# Patient Record
Sex: Female | Born: 1975
Health system: Southern US, Community
[De-identification: ages and names within clinical notes are randomized; demographics above are authoritative.]

## PROBLEM LIST (undated history)

## (undated) DIAGNOSIS — J45909 Unspecified asthma, uncomplicated: Secondary | ICD-10-CM

## (undated) DIAGNOSIS — Z9889 Other specified postprocedural states: Secondary | ICD-10-CM

## (undated) DIAGNOSIS — R51 Headache: Secondary | ICD-10-CM

## (undated) DIAGNOSIS — R011 Cardiac murmur, unspecified: Secondary | ICD-10-CM

## (undated) DIAGNOSIS — T8859XA Other complications of anesthesia, initial encounter: Secondary | ICD-10-CM

## (undated) DIAGNOSIS — R112 Nausea with vomiting, unspecified: Secondary | ICD-10-CM

## (undated) DIAGNOSIS — E162 Hypoglycemia, unspecified: Secondary | ICD-10-CM

## (undated) DIAGNOSIS — M3500 Sicca syndrome, unspecified: Secondary | ICD-10-CM

## (undated) DIAGNOSIS — M1990 Unspecified osteoarthritis, unspecified site: Secondary | ICD-10-CM

## (undated) DIAGNOSIS — M4800 Spinal stenosis, site unspecified: Secondary | ICD-10-CM

## (undated) DIAGNOSIS — T4145XA Adverse effect of unspecified anesthetic, initial encounter: Secondary | ICD-10-CM

## (undated) DIAGNOSIS — I499 Cardiac arrhythmia, unspecified: Secondary | ICD-10-CM

## (undated) DIAGNOSIS — G43909 Migraine, unspecified, not intractable, without status migrainosus: Secondary | ICD-10-CM

## (undated) DIAGNOSIS — R519 Headache, unspecified: Secondary | ICD-10-CM

## (undated) HISTORY — PX: ABDOMINAL HYSTERECTOMY: SHX81

## (undated) HISTORY — PX: FRACTURE SURGERY: SHX138

## (undated) HISTORY — PX: OTHER SURGICAL HISTORY: SHX169

---

## 2016-03-25 DIAGNOSIS — M4802 Spinal stenosis, cervical region: Secondary | ICD-10-CM | POA: Diagnosis not present

## 2016-03-25 DIAGNOSIS — R768 Other specified abnormal immunological findings in serum: Secondary | ICD-10-CM | POA: Diagnosis not present

## 2016-03-25 DIAGNOSIS — G43009 Migraine without aura, not intractable, without status migrainosus: Secondary | ICD-10-CM | POA: Diagnosis not present

## 2016-03-25 DIAGNOSIS — Z1239 Encounter for other screening for malignant neoplasm of breast: Secondary | ICD-10-CM | POA: Diagnosis not present

## 2016-03-26 DIAGNOSIS — R768 Other specified abnormal immunological findings in serum: Secondary | ICD-10-CM | POA: Diagnosis not present

## 2016-03-26 DIAGNOSIS — M3500 Sicca syndrome, unspecified: Secondary | ICD-10-CM | POA: Diagnosis not present

## 2016-03-26 DIAGNOSIS — Z8739 Personal history of other diseases of the musculoskeletal system and connective tissue: Secondary | ICD-10-CM | POA: Diagnosis not present

## 2016-03-26 DIAGNOSIS — M357 Hypermobility syndrome: Secondary | ICD-10-CM | POA: Diagnosis not present

## 2016-04-25 DIAGNOSIS — R768 Other specified abnormal immunological findings in serum: Secondary | ICD-10-CM | POA: Diagnosis not present

## 2016-04-25 DIAGNOSIS — M357 Hypermobility syndrome: Secondary | ICD-10-CM | POA: Diagnosis not present

## 2016-04-25 DIAGNOSIS — Z1231 Encounter for screening mammogram for malignant neoplasm of breast: Secondary | ICD-10-CM | POA: Diagnosis not present

## 2016-04-25 DIAGNOSIS — M3500 Sicca syndrome, unspecified: Secondary | ICD-10-CM | POA: Diagnosis not present

## 2016-04-25 DIAGNOSIS — Z8739 Personal history of other diseases of the musculoskeletal system and connective tissue: Secondary | ICD-10-CM | POA: Diagnosis not present

## 2016-05-21 DIAGNOSIS — Z01419 Encounter for gynecological examination (general) (routine) without abnormal findings: Secondary | ICD-10-CM | POA: Diagnosis not present

## 2016-05-21 DIAGNOSIS — Z124 Encounter for screening for malignant neoplasm of cervix: Secondary | ICD-10-CM | POA: Diagnosis not present

## 2016-05-21 DIAGNOSIS — Z113 Encounter for screening for infections with a predominantly sexual mode of transmission: Secondary | ICD-10-CM | POA: Diagnosis not present

## 2016-05-21 DIAGNOSIS — Z1151 Encounter for screening for human papillomavirus (HPV): Secondary | ICD-10-CM | POA: Diagnosis not present

## 2016-07-07 DIAGNOSIS — Z113 Encounter for screening for infections with a predominantly sexual mode of transmission: Secondary | ICD-10-CM | POA: Diagnosis not present

## 2016-07-07 DIAGNOSIS — L80 Vitiligo: Secondary | ICD-10-CM | POA: Diagnosis not present

## 2016-07-07 DIAGNOSIS — R300 Dysuria: Secondary | ICD-10-CM | POA: Diagnosis not present

## 2016-07-07 DIAGNOSIS — N898 Other specified noninflammatory disorders of vagina: Secondary | ICD-10-CM | POA: Diagnosis not present

## 2016-07-07 DIAGNOSIS — N766 Ulceration of vulva: Secondary | ICD-10-CM | POA: Diagnosis not present

## 2016-12-21 ENCOUNTER — Encounter (HOSPITAL_BASED_OUTPATIENT_CLINIC_OR_DEPARTMENT_OTHER): Payer: Self-pay | Admitting: Emergency Medicine

## 2016-12-21 ENCOUNTER — Emergency Department (HOSPITAL_BASED_OUTPATIENT_CLINIC_OR_DEPARTMENT_OTHER)
Admission: EM | Admit: 2016-12-21 | Discharge: 2016-12-21 | Disposition: A | Discharge status: Home / Self Care | Payer: BLUE CROSS/BLUE SHIELD | Attending: Emergency Medicine | Admitting: Emergency Medicine

## 2016-12-21 ENCOUNTER — Emergency Department (HOSPITAL_BASED_OUTPATIENT_CLINIC_OR_DEPARTMENT_OTHER): Payer: BLUE CROSS/BLUE SHIELD

## 2016-12-21 DIAGNOSIS — R079 Chest pain, unspecified: Secondary | ICD-10-CM | POA: Diagnosis not present

## 2016-12-21 DIAGNOSIS — J45909 Unspecified asthma, uncomplicated: Secondary | ICD-10-CM | POA: Insufficient documentation

## 2016-12-21 DIAGNOSIS — R0789 Other chest pain: Secondary | ICD-10-CM | POA: Diagnosis not present

## 2016-12-21 HISTORY — DX: Spinal stenosis, site unspecified: M48.00

## 2016-12-21 HISTORY — DX: Cardiac murmur, unspecified: R01.1

## 2016-12-21 HISTORY — DX: Unspecified asthma, uncomplicated: J45.909

## 2016-12-21 LAB — BASIC METABOLIC PANEL
Anion gap: 7 (ref 5–15)
BUN: 15 mg/dL (ref 6–20)
CO2: 24 mmol/L (ref 22–32)
Calcium: 9.4 mg/dL (ref 8.9–10.3)
Chloride: 105 mmol/L (ref 101–111)
Creatinine, Ser: 0.75 mg/dL (ref 0.44–1.00)
GFR calc Af Amer: >60 mL/min (ref >60)
GFR calc non Af Amer: >60 mL/min (ref >60)
Glucose, Bld: 94 mg/dL (ref 65–99)
Potassium: 3.8 mmol/L (ref 3.5–5.1)
Sodium: 136 mmol/L (ref 135–145)

## 2016-12-21 LAB — D-DIMER, QUANTITATIVE: D-Dimer, Quant: <0.27 ug/mL-FEU (ref 0.00–0.50)

## 2016-12-21 LAB — CBC
HCT: 37.7 % (ref 36.0–46.0)
Hemoglobin: 12.5 g/dL (ref 12.0–15.0)
MCH: 28.4 pg (ref 26.0–34.0)
MCHC: 33.2 g/dL (ref 30.0–36.0)
MCV: 85.7 fL (ref 78.0–100.0)
Platelets: 321 10*3/uL (ref 150–400)
RBC: 4.4 MIL/uL (ref 3.87–5.11)
RDW: 13.5 % (ref 11.5–15.5)
WBC: 9.5 10*3/uL (ref 4.0–10.5)

## 2016-12-21 LAB — TROPONIN I: Troponin I: <0.03 ng/mL (ref <0.03)

## 2016-12-21 MED ORDER — CYCLOBENZAPRINE HCL 5 MG PO TABS
5.0000 mg | ORAL_TABLET | Freq: Once | ORAL | Status: AC
  Administered 2016-12-21: 5 mg via ORAL
  Filled 2016-12-21: qty 1

## 2016-12-21 MED ORDER — KETOROLAC TROMETHAMINE 15 MG/ML IJ SOLN
15.0000 mg | Freq: Once | INTRAMUSCULAR | Status: AC
  Administered 2016-12-21: 15 mg via INTRAVENOUS
  Filled 2016-12-21: qty 1

## 2016-12-21 NOTE — ED Triage Notes (Signed)
Pt reports pain in L chest radiating to L shoulder and L jaw x2 months intermittent.  Pt also states that when these episodes occur, she has some sob and nausea.  Pt also mentions a migraine that shes had for 1.5 weeks.  Pt has been evaluated in clinic twice for this.

## 2016-12-21 NOTE — ED Notes (Signed)
EKG given to Dr. Rees 

## 2016-12-21 NOTE — ED Notes (Signed)
Pt given d/c instructions as per chart. Verbalizes understanding. No questions. 

## 2016-12-21 NOTE — ED Notes (Signed)
While attempting to d/c pt, pt had a couple of episodes of sharp left side CP lasting several secs then resolved. VSS. Placed back on monitor and EDP advised. Pt also requesting something to eat. Given crackers, cheese and soda.

## 2016-12-21 NOTE — ED Provider Notes (Signed)
[facility] DEPT [facility] Provider Note   CSN: [id number] Arrival date & time: 12/21/16  1617 By signing my name below, I, Dyke Brackett, attest that this documentation has been prepared under the direction and in the presence of Quintella Reichert, MD . Electronically Signed: Dyke Brackett, Scribe. 12/21/2016. 5:34 PM.   History   Chief Complaint Chief Complaint  Patient presents with  . Chest Pain   HPI Alexis Bernard is a 41 y.o. female who presents to the Emergency Department complaining of sudden onset, intermittent left chest pain onset this afternoon.She describes her pain as sharp, shooting pain that radiates to her left arm. She also describes a pressure-like pain to her central-left chest.  This is a recurrent problem; pt has had these sharp pains intermittently for 2 months, but states today was the most severe the pain has been. No alleviating or modifying factors noted.  No treatments tried PTA.  She notes associated night sweats, nonproductive cough, and nausea. She also reports chronic swelling and myalgias to her left leg which is unchanged. She denies any hx of blood clots. NKDA. She is not on any hormones. She reports family Hx of HTN. Pt denies any SOB or hemoptysis. Pt has no other complaints or symptoms at this time.    The history is provided by the patient. No language interpreter was used.    Past Medical History:  Diagnosis Date  . Asthma   . Murmur   . Spinal stenosis     There are no active problems to display for this patient.   Past Surgical History:  Procedure Laterality Date  . FRACTURE SURGERY     OB History    No data available      Home Medications    Prior to Admission medications   Not on File    Family History History reviewed. No pertinent family history.  Social History Social History  Substance Use Topics  . Smoking status: Never Smoker  . Smokeless tobacco: Not on file  . Alcohol use Yes     Comment: occ      Allergies   Patient has no known allergies.   Review of Systems Review of Systems 10 systems reviewed and all are negative for acute change except as noted in the HPI.  Physical Exam Updated Vital Signs BP 125/78 (BP Location: Left Arm)   Pulse 84   Temp 98.1 F (36.7 C) (Oral)   Resp 18   Ht 5\' 2"  (1.575 m)   Wt 163 lb (73.9 kg)   LMP 11/23/2016 (Approximate)   SpO2 100%   BMI 29.81 kg/m   Physical Exam  Constitutional: She is oriented to person, place, and time. She appears well-developed and well-nourished.  HENT:  Head: Normocephalic and atraumatic.  Cardiovascular: Normal rate and regular rhythm.   No murmur heard. Pulmonary/Chest: Effort normal and breath sounds normal. No respiratory distress. She exhibits tenderness.  Abdominal: Soft. There is no tenderness. There is no rebound and no guarding.  Musculoskeletal: She exhibits no edema or tenderness.  Neurological: She is alert and oriented to person, place, and time.  Skin: Skin is warm and dry.  Psychiatric: She has a normal mood and affect. Her behavior is normal.  Nursing note and vitals reviewed.  ED Treatments / Results  DIAGNOSTIC STUDIES:  Oxygen Saturation is 100% on RA, normal by my interpretation.    COORDINATION OF CARE:  5:28 PM Discussed treatment plan with pt at bedside and pt agreed to plan.  Labs (all labs ordered are listed, but only abnormal results are displayed) Labs Reviewed  BASIC METABOLIC PANEL  CBC  TROPONIN I  D-DIMER, QUANTITATIVE (NOT AT [facility])    EKG  EKG Interpretation  Date/Time:  Sunday December 21 2016 16:26:36 EST Ventricular Rate:  83 PR Interval:  140 QRS Duration: 88 QT Interval:  392 QTC Calculation: 460 R Axis:   100 Text Interpretation:  Normal sinus rhythm Rightward axis Borderline ECG Confirmed by Hazle Coca [phone]) on 12/21/2016 5:30:09 PM       Radiology Dg Chest 2 View  Result Date: 12/21/2016 CLINICAL DATA:  Left chest pain EXAM: CHEST  2  VIEW COMPARISON:  None. FINDINGS: Lungs are clear.  No pleural effusion or pneumothorax. The heart is normal in size. Visualized osseous structures are within normal limits. IMPRESSION: Normal chest radiographs. Electronically Signed   By: Julian Hy M.D.   On: 12/21/2016 16:53    Procedures Procedures (including critical care time)  Medications Ordered in ED Medications  ketorolac (TORADOL) 15 MG/ML injection 15 mg (15 mg Intravenous Given 12/21/16 1843)  cyclobenzaprine (FLEXERIL) tablet 5 mg (5 mg Oral Given 12/21/16 1912)     Initial Impression / Assessment and Plan / ED Course  I have reviewed the triage vital signs and the nursing notes.  Pertinent labs & imaging results that were available during my care of the patient were reviewed by me and considered in my medical decision making (see chart for details).     Patient here for evaluation of chest pain that has been intermittent over the last 2 months. There is some reproducible elements to her pain on examination. Current clinical picture is not consistent with ACS, PE, dissection, pneumonia. Discussed with patient likely musculoskeletal pain. Discussed outpatient follow-up and return precautions.  Final Clinical Impressions(s) / ED Diagnoses   Final diagnoses:  Chest wall pain   New Prescriptions There are no discharge medications for this patient. I personally performed the services described in this documentation, which was scribed in my presence. The recorded information has been reviewed and is accurate.    Quintella Reichert, MD 12/22/16 [phone]

## 2016-12-25 DIAGNOSIS — M4802 Spinal stenosis, cervical region: Secondary | ICD-10-CM | POA: Diagnosis not present

## 2016-12-25 DIAGNOSIS — M797 Fibromyalgia: Secondary | ICD-10-CM | POA: Diagnosis not present

## 2016-12-26 DIAGNOSIS — M25559 Pain in unspecified hip: Secondary | ICD-10-CM | POA: Diagnosis not present

## 2016-12-26 DIAGNOSIS — R102 Pelvic and perineal pain: Secondary | ICD-10-CM | POA: Diagnosis not present

## 2016-12-26 DIAGNOSIS — M545 Low back pain: Secondary | ICD-10-CM | POA: Diagnosis not present

## 2017-04-18 DIAGNOSIS — T300 Burn of unspecified body region, unspecified degree: Secondary | ICD-10-CM | POA: Diagnosis not present

## 2017-05-12 DIAGNOSIS — R002 Palpitations: Secondary | ICD-10-CM | POA: Diagnosis not present

## 2017-05-12 DIAGNOSIS — Z1231 Encounter for screening mammogram for malignant neoplasm of breast: Secondary | ICD-10-CM | POA: Diagnosis not present

## 2017-05-24 DIAGNOSIS — J209 Acute bronchitis, unspecified: Secondary | ICD-10-CM | POA: Diagnosis not present

## 2017-05-24 DIAGNOSIS — R062 Wheezing: Secondary | ICD-10-CM | POA: Diagnosis not present

## 2017-06-08 DIAGNOSIS — Z1231 Encounter for screening mammogram for malignant neoplasm of breast: Secondary | ICD-10-CM | POA: Diagnosis not present

## 2017-08-06 DIAGNOSIS — R350 Frequency of micturition: Secondary | ICD-10-CM | POA: Diagnosis not present

## 2017-08-06 DIAGNOSIS — M4802 Spinal stenosis, cervical region: Secondary | ICD-10-CM | POA: Diagnosis not present

## 2017-08-06 DIAGNOSIS — Z23 Encounter for immunization: Secondary | ICD-10-CM | POA: Diagnosis not present

## 2017-08-06 DIAGNOSIS — G43009 Migraine without aura, not intractable, without status migrainosus: Secondary | ICD-10-CM | POA: Diagnosis not present

## 2017-08-06 DIAGNOSIS — M797 Fibromyalgia: Secondary | ICD-10-CM | POA: Diagnosis not present

## 2017-08-11 DIAGNOSIS — R002 Palpitations: Secondary | ICD-10-CM | POA: Diagnosis not present

## 2017-08-12 DIAGNOSIS — M4802 Spinal stenosis, cervical region: Secondary | ICD-10-CM | POA: Diagnosis not present

## 2017-08-12 DIAGNOSIS — R292 Abnormal reflex: Secondary | ICD-10-CM | POA: Diagnosis not present

## 2017-08-15 DIAGNOSIS — M797 Fibromyalgia: Secondary | ICD-10-CM | POA: Diagnosis not present

## 2017-08-15 DIAGNOSIS — R350 Frequency of micturition: Secondary | ICD-10-CM | POA: Diagnosis not present

## 2017-09-11 DIAGNOSIS — M5031 Other cervical disc degeneration,  high cervical region: Secondary | ICD-10-CM | POA: Diagnosis not present

## 2017-09-11 DIAGNOSIS — M2578 Osteophyte, vertebrae: Secondary | ICD-10-CM | POA: Diagnosis not present

## 2017-09-16 DIAGNOSIS — R292 Abnormal reflex: Secondary | ICD-10-CM | POA: Diagnosis not present

## 2017-09-16 DIAGNOSIS — M4802 Spinal stenosis, cervical region: Secondary | ICD-10-CM | POA: Diagnosis not present

## 2017-09-19 DIAGNOSIS — M50221 Other cervical disc displacement at C4-C5 level: Secondary | ICD-10-CM | POA: Diagnosis not present

## 2017-09-19 DIAGNOSIS — M48061 Spinal stenosis, lumbar region without neurogenic claudication: Secondary | ICD-10-CM | POA: Diagnosis not present

## 2017-09-19 DIAGNOSIS — M4802 Spinal stenosis, cervical region: Secondary | ICD-10-CM | POA: Diagnosis not present

## 2017-09-19 DIAGNOSIS — M5125 Other intervertebral disc displacement, thoracolumbar region: Secondary | ICD-10-CM | POA: Diagnosis not present

## 2017-09-19 DIAGNOSIS — M5136 Other intervertebral disc degeneration, lumbar region: Secondary | ICD-10-CM | POA: Diagnosis not present

## 2017-09-19 DIAGNOSIS — M50222 Other cervical disc displacement at C5-C6 level: Secondary | ICD-10-CM | POA: Diagnosis not present

## 2017-09-19 DIAGNOSIS — M5124 Other intervertebral disc displacement, thoracic region: Secondary | ICD-10-CM | POA: Diagnosis not present

## 2017-09-21 DIAGNOSIS — M791 Myalgia, unspecified site: Secondary | ICD-10-CM | POA: Diagnosis not present

## 2017-09-21 DIAGNOSIS — S4991XA Unspecified injury of right shoulder and upper arm, initial encounter: Secondary | ICD-10-CM | POA: Diagnosis not present

## 2017-09-22 ENCOUNTER — Other Ambulatory Visit: Payer: Self-pay | Admitting: Neurological Surgery

## 2017-09-22 DIAGNOSIS — R030 Elevated blood-pressure reading, without diagnosis of hypertension: Secondary | ICD-10-CM | POA: Diagnosis not present

## 2017-09-22 DIAGNOSIS — M542 Cervicalgia: Secondary | ICD-10-CM | POA: Diagnosis not present

## 2017-09-22 DIAGNOSIS — Z6830 Body mass index (BMI) 30.0-30.9, adult: Secondary | ICD-10-CM | POA: Diagnosis not present

## 2017-10-05 DIAGNOSIS — R768 Other specified abnormal immunological findings in serum: Secondary | ICD-10-CM | POA: Diagnosis not present

## 2017-10-05 DIAGNOSIS — G43009 Migraine without aura, not intractable, without status migrainosus: Secondary | ICD-10-CM | POA: Diagnosis not present

## 2017-10-05 DIAGNOSIS — G959 Disease of spinal cord, unspecified: Secondary | ICD-10-CM | POA: Diagnosis not present

## 2017-10-05 DIAGNOSIS — F4310 Post-traumatic stress disorder, unspecified: Secondary | ICD-10-CM | POA: Diagnosis not present

## 2017-10-08 ENCOUNTER — Encounter: Payer: Self-pay | Admitting: Neurology

## 2017-10-22 DIAGNOSIS — R768 Other specified abnormal immunological findings in serum: Secondary | ICD-10-CM | POA: Diagnosis not present

## 2017-10-22 DIAGNOSIS — Z8739 Personal history of other diseases of the musculoskeletal system and connective tissue: Secondary | ICD-10-CM | POA: Diagnosis not present

## 2017-10-22 DIAGNOSIS — G959 Disease of spinal cord, unspecified: Secondary | ICD-10-CM | POA: Diagnosis not present

## 2017-10-22 DIAGNOSIS — R9401 Abnormal electroencephalogram [EEG]: Secondary | ICD-10-CM | POA: Diagnosis not present

## 2017-10-26 NOTE — Pre-Procedure Instructions (Signed)
[place]  10/26/2017      [facility] [phone] - [place], [place] - [address] AT [address] [place] [address] Phone: [phone] Fax: [phone]    Your procedure is scheduled on October 29, 2017.  Report to [facility] Admitting at 07:45 A.M.  Call this number if you have problems the morning of surgery:  [phone]   Remember:  Do not eat food or drink liquids after midnight.   Take these medicines the morning of surgery with A SIP OF WATER :  Cyclobenzaprine (Flexeril) if needed Sumatriptan (Imitrex) if needed Albuterol Inhaler if needed, bring with you  7 days prior to surgery STOP taking any Aspirin(unless otherwise instructed by your surgeon), Aleve, Naproxen, Meloxicam, Mobic,  Ibuprofen, Motrin, Advil, Goody's, BC's, all herbal medications, fish oil, and all vitamins.   Do not wear jewelry, make-up or nail polish.  Do not wear lotions, powders, or perfumes, or deoderant.  Do not shave 48 hours prior to surgery.   Do not bring valuables to the hospital.   [facility] is not responsible for any belongings or valuables.  Contacts, dentures or bridgework may not be worn into surgery.  Leave your suitcase in the car.  After surgery it may be brought to your room.  For patients admitted to the hospital, discharge time will be determined by your treatment team.  Patients discharged the day of surgery will not be allowed to drive home.   Special instructions:   [facility]- Preparing For Surgery  Before surgery, you can play an important role. Because skin is not sterile, your skin needs to be as free of germs as possible. You can reduce the number of germs on your skin by washing with CHG (chlorahexidine gluconate) Soap before surgery.  CHG is an antiseptic cleaner which kills germs and bonds with the skin to continue killing germs even after washing.  Please do not use if you have  an allergy to CHG or antibacterial soaps. If your skin becomes reddened/irritated stop using the CHG.  Do not shave (including legs and underarms) for at least 48 hours prior to first CHG shower. It is OK to shave your face.  Please follow these instructions carefully.   1. Shower the NIGHT BEFORE SURGERY and the MORNING OF SURGERY with CHG.   2. If you chose to wash your hair, wash your hair first as usual with your normal shampoo.  3. After you shampoo, rinse your hair and body thoroughly to remove the shampoo.  4. Use CHG as you would any other liquid soap. You can apply CHG directly to the skin and wash gently with a scrungie or a clean washcloth.   5. Apply the CHG Soap to your body ONLY FROM THE NECK DOWN.  Do not use on open wounds or open sores. Avoid contact with your eyes, ears, mouth and genitals (private parts). Wash Face and genitals (private parts)  with your normal soap.  6. Wash thoroughly, paying special attention to the area where your surgery will be performed.  7. Thoroughly rinse your body with warm water from the neck down.  8. DO NOT shower/wash with your normal soap after using and rinsing off the CHG Soap.  9. Pat yourself dry with a CLEAN TOWEL.  10. Wear CLEAN PAJAMAS to bed the night before surgery, wear comfortable clothes the morning of surgery  11. Place CLEAN  SHEETS on your bed the night of your first shower and DO NOT SLEEP WITH PETS.    Day of Surgery: Do not apply any deodorants/lotions. Please wear clean clothes to the hospital/surgery center.      Please read over the following fact sheets that you were given. Coughing and Deep Breathing and Surgical Site Infection Prevention

## 2017-10-27 ENCOUNTER — Encounter (HOSPITAL_COMMUNITY)
Admission: RE | Admit: 2017-10-27 | Discharge: 2017-10-27 | Disposition: A | Discharge status: Home / Self Care | Payer: BLUE CROSS/BLUE SHIELD | Source: Ambulatory Visit | Attending: Neurological Surgery | Admitting: Neurological Surgery

## 2017-10-27 ENCOUNTER — Encounter (HOSPITAL_COMMUNITY): Payer: Self-pay

## 2017-10-27 ENCOUNTER — Other Ambulatory Visit: Payer: Self-pay

## 2017-10-27 DIAGNOSIS — J449 Chronic obstructive pulmonary disease, unspecified: Secondary | ICD-10-CM | POA: Diagnosis not present

## 2017-10-27 DIAGNOSIS — Z7982 Long term (current) use of aspirin: Secondary | ICD-10-CM | POA: Diagnosis not present

## 2017-10-27 DIAGNOSIS — Z01812 Encounter for preprocedural laboratory examination: Secondary | ICD-10-CM | POA: Insufficient documentation

## 2017-10-27 DIAGNOSIS — Z91040 Latex allergy status: Secondary | ICD-10-CM | POA: Diagnosis not present

## 2017-10-27 DIAGNOSIS — M542 Cervicalgia: Secondary | ICD-10-CM | POA: Diagnosis not present

## 2017-10-27 DIAGNOSIS — M50022 Cervical disc disorder at C5-C6 level with myelopathy: Secondary | ICD-10-CM | POA: Diagnosis not present

## 2017-10-27 DIAGNOSIS — M1990 Unspecified osteoarthritis, unspecified site: Secondary | ICD-10-CM | POA: Diagnosis not present

## 2017-10-27 DIAGNOSIS — M4712 Other spondylosis with myelopathy, cervical region: Secondary | ICD-10-CM | POA: Diagnosis not present

## 2017-10-27 DIAGNOSIS — M4802 Spinal stenosis, cervical region: Secondary | ICD-10-CM | POA: Diagnosis not present

## 2017-10-27 DIAGNOSIS — Z79899 Other long term (current) drug therapy: Secondary | ICD-10-CM | POA: Diagnosis not present

## 2017-10-27 HISTORY — DX: Headache: R51

## 2017-10-27 HISTORY — DX: Other specified postprocedural states: Z98.890

## 2017-10-27 HISTORY — DX: Other complications of anesthesia, initial encounter: T88.59XA

## 2017-10-27 HISTORY — DX: Headache, unspecified: R51.9

## 2017-10-27 HISTORY — DX: Unspecified osteoarthritis, unspecified site: M19.90

## 2017-10-27 HISTORY — DX: Hypoglycemia, unspecified: E16.2

## 2017-10-27 HISTORY — DX: Cardiac arrhythmia, unspecified: I49.9

## 2017-10-27 HISTORY — DX: Nausea with vomiting, unspecified: R11.2

## 2017-10-27 HISTORY — DX: Adverse effect of unspecified anesthetic, initial encounter: T41.45XA

## 2017-10-27 LAB — CBC WITH DIFFERENTIAL/PLATELET
Basophils Absolute: 0 10*3/uL (ref 0.0–0.1)
Basophils Relative: 0 %
Eosinophils Absolute: 0.1 10*3/uL (ref 0.0–0.7)
Eosinophils Relative: 2 %
HCT: 38.2 % (ref 36.0–46.0)
Hemoglobin: 11.9 g/dL — ABNORMAL LOW (ref 12.0–15.0)
Lymphocytes Relative: 32 %
Lymphs Abs: 2 10*3/uL (ref 0.7–4.0)
MCH: 26.3 pg (ref 26.0–34.0)
MCHC: 31.2 g/dL (ref 30.0–36.0)
MCV: 84.5 fL (ref 78.0–100.0)
Monocytes Absolute: 0.5 10*3/uL (ref 0.1–1.0)
Monocytes Relative: 8 %
Neutro Abs: 3.7 10*3/uL (ref 1.7–7.7)
Neutrophils Relative %: 58 %
Platelets: 317 10*3/uL (ref 150–400)
RBC: 4.52 MIL/uL (ref 3.87–5.11)
RDW: 14.5 % (ref 11.5–15.5)
WBC: 6.4 10*3/uL (ref 4.0–10.5)

## 2017-10-27 LAB — BASIC METABOLIC PANEL
Anion gap: 7 (ref 5–15)
BUN: 13 mg/dL (ref 6–20)
CO2: 23 mmol/L (ref 22–32)
Calcium: 9.3 mg/dL (ref 8.9–10.3)
Chloride: 106 mmol/L (ref 101–111)
Creatinine, Ser: 0.81 mg/dL (ref 0.44–1.00)
GFR calc Af Amer: >60 mL/min (ref >60)
GFR calc non Af Amer: >60 mL/min (ref >60)
Glucose, Bld: 100 mg/dL — ABNORMAL HIGH (ref 65–99)
Potassium: 4.2 mmol/L (ref 3.5–5.1)
Sodium: 136 mmol/L (ref 135–145)

## 2017-10-27 LAB — HCG, SERUM, QUALITATIVE: Preg, Serum: NEGATIVE

## 2017-10-27 LAB — SURGICAL PCR SCREEN
MRSA, PCR: NEGATIVE
Staphylococcus aureus: NEGATIVE

## 2017-10-27 LAB — PROTIME-INR
INR: 1.06
Prothrombin Time: 13.7 seconds (ref 11.4–15.2)

## 2017-10-27 LAB — NO BLOOD PRODUCTS

## 2017-10-27 MED ORDER — CHLORHEXIDINE GLUCONATE CLOTH 2 % EX PADS: 6.0000 | MEDICATED_PAD | Freq: Once | CUTANEOUS | Status: DC

## 2017-10-27 NOTE — Progress Notes (Addendum)
PCP: Verita Schneiders, MD  Cardiologist: pt denies   EKG: 12/21/2016 in Epic  Stress test: pt denies ever  ECHO: pt denies ever  Cardiac Cath: pt denies ever  Chest x-ray: 12/2016 in [facility]

## 2017-10-28 NOTE — Anesthesia Preprocedure Evaluation (Addendum)
Anesthesia Evaluation  Patient identified by MRN, date of birth, ID band Patient awake    Reviewed: Allergy & Precautions, NPO status , Patient's Chart, lab work & pertinent test results  History of Anesthesia Complications (+) PONV  Airway Mallampati: II  TM Distance: >3 FB Neck ROM: Full    Dental  (+) Dental Advisory Given   Pulmonary asthma , COPD (last inhaler needed a few months ago),  COPD inhaler,    breath sounds clear to auscultation       Cardiovascular negative cardio ROS   Rhythm:Regular Rate:Normal     Neuro/Psych  Headaches,    GI/Hepatic negative GI ROS, Neg liver ROS,   Endo/Other  negative endocrine ROS  Renal/GU negative Renal ROS     Musculoskeletal  (+) Arthritis ,   Abdominal   Peds  Hematology  (+) anemia ,   Anesthesia Other Findings   Reproductive/Obstetrics                           Lab Results  Component Value Date   WBC 6.4 10/27/2017   HGB 11.9 (L) 10/27/2017   HCT 38.2 10/27/2017   MCV 84.5 10/27/2017   PLT 317 10/27/2017   Lab Results  Component Value Date   CREATININE 0.81 10/27/2017   BUN 13 10/27/2017   NA 136 10/27/2017   K 4.2 10/27/2017   CL 106 10/27/2017   CO2 23 10/27/2017    Anesthesia Physical Anesthesia Plan  ASA: II  Anesthesia Plan: General   Post-op Pain Management:    Induction: Intravenous  PONV Risk Score and Plan: 4 or greater and Ondansetron, Dexamethasone, Scopolamine patch - Pre-op, Midazolam and Diphenhydramine  Airway Management Planned: Oral ETT  Additional Equipment:   Intra-op Plan:   Post-operative Plan: Extubation in OR  Informed Consent: I have reviewed the patients History and Physical, chart, labs and discussed the procedure including the risks, benefits and alternatives for the proposed anesthesia with the patient or authorized representative who has indicated his/her understanding and acceptance.    Dental advisory given  Plan Discussed with: CRNA and Surgeon  Anesthesia Plan Comments: (Plan routine monitors, GETA)       Anesthesia Quick Evaluation

## 2017-10-29 ENCOUNTER — Ambulatory Visit (HOSPITAL_COMMUNITY): Payer: BLUE CROSS/BLUE SHIELD

## 2017-10-29 ENCOUNTER — Ambulatory Visit (HOSPITAL_COMMUNITY): Payer: BLUE CROSS/BLUE SHIELD | Admitting: Anesthesiology

## 2017-10-29 ENCOUNTER — Encounter (HOSPITAL_COMMUNITY): Payer: Self-pay | Admitting: *Deleted

## 2017-10-29 ENCOUNTER — Encounter (HOSPITAL_COMMUNITY)
Admission: RE | Disposition: A | Discharge status: Home / Self Care | Payer: Self-pay | Source: Ambulatory Visit | Attending: Neurological Surgery

## 2017-10-29 ENCOUNTER — Observation Stay (HOSPITAL_COMMUNITY)
Admission: RE | Admit: 2017-10-29 | Discharge: 2017-10-30 | Disposition: A | Discharge status: Home / Self Care | Payer: BLUE CROSS/BLUE SHIELD | Source: Ambulatory Visit | Attending: Neurological Surgery | Admitting: Neurological Surgery

## 2017-10-29 DIAGNOSIS — M1990 Unspecified osteoarthritis, unspecified site: Secondary | ICD-10-CM | POA: Diagnosis not present

## 2017-10-29 DIAGNOSIS — Z419 Encounter for procedure for purposes other than remedying health state, unspecified: Secondary | ICD-10-CM

## 2017-10-29 DIAGNOSIS — Z91040 Latex allergy status: Secondary | ICD-10-CM | POA: Diagnosis not present

## 2017-10-29 DIAGNOSIS — Z79899 Other long term (current) drug therapy: Secondary | ICD-10-CM | POA: Diagnosis not present

## 2017-10-29 DIAGNOSIS — Z7982 Long term (current) use of aspirin: Secondary | ICD-10-CM | POA: Diagnosis not present

## 2017-10-29 DIAGNOSIS — J449 Chronic obstructive pulmonary disease, unspecified: Secondary | ICD-10-CM | POA: Insufficient documentation

## 2017-10-29 DIAGNOSIS — M4802 Spinal stenosis, cervical region: Secondary | ICD-10-CM | POA: Diagnosis not present

## 2017-10-29 DIAGNOSIS — M50022 Cervical disc disorder at C5-C6 level with myelopathy: Secondary | ICD-10-CM | POA: Diagnosis not present

## 2017-10-29 DIAGNOSIS — M4322 Fusion of spine, cervical region: Secondary | ICD-10-CM | POA: Diagnosis not present

## 2017-10-29 DIAGNOSIS — M4712 Other spondylosis with myelopathy, cervical region: Secondary | ICD-10-CM | POA: Diagnosis not present

## 2017-10-29 DIAGNOSIS — M542 Cervicalgia: Secondary | ICD-10-CM | POA: Diagnosis not present

## 2017-10-29 HISTORY — DX: Sjogren syndrome, unspecified: M35.00

## 2017-10-29 HISTORY — PX: ANTERIOR CERVICAL DECOMP/DISCECTOMY FUSION: SHX1161

## 2017-10-29 SURGERY — ANTERIOR CERVICAL DECOMPRESSION/DISCECTOMY FUSION 2 LEVELS
Anesthesia: General

## 2017-10-29 MED ORDER — BUPIVACAINE HCL (PF) 0.25 % IJ SOLN
INTRAMUSCULAR | Status: DC | PRN
  Administered 2017-10-29: 4 mL

## 2017-10-29 MED ORDER — ONDANSETRON HCL 4 MG/2ML IJ SOLN
INTRAMUSCULAR | Status: AC
  Filled 2017-10-29: qty 4

## 2017-10-29 MED ORDER — HYDROXYZINE HCL 50 MG/ML IM SOLN
50.0000 mg | Freq: Four times a day (QID) | INTRAMUSCULAR | Status: DC | PRN
  Administered 2017-10-29: 50 mg via INTRAMUSCULAR
  Filled 2017-10-29: qty 1

## 2017-10-29 MED ORDER — ONDANSETRON HCL 4 MG PO TABS: 4.0000 mg | ORAL_TABLET | Freq: Four times a day (QID) | ORAL | Status: DC | PRN

## 2017-10-29 MED ORDER — ALBUTEROL SULFATE (2.5 MG/3ML) 0.083% IN NEBU: 3.0000 mL | INHALATION_SOLUTION | Freq: Four times a day (QID) | RESPIRATORY_TRACT | Status: DC | PRN

## 2017-10-29 MED ORDER — CEFAZOLIN SODIUM-DEXTROSE 2-4 GM/100ML-% IV SOLN
2.0000 g | Freq: Three times a day (TID) | INTRAVENOUS | Status: AC
  Administered 2017-10-29 – 2017-10-30 (×2): 2 g via INTRAVENOUS
  Filled 2017-10-29 (×2): qty 100

## 2017-10-29 MED ORDER — ROCURONIUM BROMIDE 10 MG/ML (PF) SYRINGE
PREFILLED_SYRINGE | INTRAVENOUS | Status: AC
  Filled 2017-10-29: qty 5

## 2017-10-29 MED ORDER — FENTANYL CITRATE (PF) 250 MCG/5ML IJ SOLN
INTRAMUSCULAR | Status: AC
Start: 2017-10-29 — End: 2017-10-29
  Filled 2017-10-29: qty 5

## 2017-10-29 MED ORDER — THROMBIN (RECOMBINANT) 5000 UNITS EX SOLR
CUTANEOUS | Status: DC | PRN
  Administered 2017-10-29 (×2): 5000 [IU] via TOPICAL

## 2017-10-29 MED ORDER — EPHEDRINE 5 MG/ML INJ
INTRAVENOUS | Status: AC
  Filled 2017-10-29: qty 10

## 2017-10-29 MED ORDER — SODIUM CHLORIDE 0.9% FLUSH: 3.0000 mL | INTRAVENOUS | Status: DC | PRN

## 2017-10-29 MED ORDER — FENTANYL CITRATE (PF) 100 MCG/2ML IJ SOLN
INTRAMUSCULAR | Status: DC | PRN
  Administered 2017-10-29: 50 ug via INTRAVENOUS
  Administered 2017-10-29: 200 ug via INTRAVENOUS

## 2017-10-29 MED ORDER — SCOPOLAMINE 1 MG/3DAYS TD PT72
MEDICATED_PATCH | TRANSDERMAL | Status: DC | PRN
  Administered 2017-10-29: 1 via TRANSDERMAL

## 2017-10-29 MED ORDER — MIDAZOLAM HCL 2 MG/2ML IJ SOLN
INTRAMUSCULAR | Status: AC
  Filled 2017-10-29: qty 2

## 2017-10-29 MED ORDER — EPHEDRINE SULFATE 50 MG/ML IJ SOLN
INTRAMUSCULAR | Status: DC | PRN
  Administered 2017-10-29: 10 mg via INTRAVENOUS

## 2017-10-29 MED ORDER — HYDROCODONE-ACETAMINOPHEN 7.5-325 MG PO TABS
ORAL_TABLET | ORAL | Status: AC
  Administered 2017-10-29: 2 via ORAL
  Filled 2017-10-29: qty 2

## 2017-10-29 MED ORDER — LIDOCAINE HCL (CARDIAC) 20 MG/ML IV SOLN
INTRAVENOUS | Status: DC | PRN
  Administered 2017-10-29: 20 mg via INTRAVENOUS

## 2017-10-29 MED ORDER — DIPHENHYDRAMINE HCL 50 MG/ML IJ SOLN
25.0000 mg | Freq: Four times a day (QID) | INTRAMUSCULAR | Status: DC | PRN
  Administered 2017-10-29: 25 mg via INTRAVENOUS

## 2017-10-29 MED ORDER — MENTHOL 3 MG MT LOZG: 1.0000 | LOZENGE | OROMUCOSAL | Status: DC | PRN

## 2017-10-29 MED ORDER — HYDROMORPHONE HCL 1 MG/ML IJ SOLN
INTRAMUSCULAR | Status: AC
  Administered 2017-10-29: 0.5 mg via INTRAVENOUS
  Filled 2017-10-29: qty 1

## 2017-10-29 MED ORDER — MEPERIDINE HCL 25 MG/ML IJ SOLN
6.2500 mg | INTRAMUSCULAR | Status: DC | PRN
Start: 2017-10-29 — End: 2017-10-29

## 2017-10-29 MED ORDER — POTASSIUM CHLORIDE IN NACL 20-0.9 MEQ/L-% IV SOLN: INTRAVENOUS | Status: DC

## 2017-10-29 MED ORDER — SODIUM CHLORIDE 0.9% FLUSH
3.0000 mL | Freq: Two times a day (BID) | INTRAVENOUS | Status: DC
  Administered 2017-10-29: 3 mL via INTRAVENOUS

## 2017-10-29 MED ORDER — METHOCARBAMOL 500 MG PO TABS
500.0000 mg | ORAL_TABLET | Freq: Four times a day (QID) | ORAL | Status: DC | PRN
  Administered 2017-10-29 – 2017-10-30 (×3): 500 mg via ORAL
  Filled 2017-10-29 (×3): qty 1

## 2017-10-29 MED ORDER — DEXAMETHASONE SODIUM PHOSPHATE 10 MG/ML IJ SOLN
10.0000 mg | INTRAMUSCULAR | Status: AC
  Administered 2017-10-29: 10 mg via INTRAVENOUS
  Filled 2017-10-29: qty 1

## 2017-10-29 MED ORDER — LIDOCAINE 2% (20 MG/ML) 5 ML SYRINGE
INTRAMUSCULAR | Status: AC
  Filled 2017-10-29: qty 5

## 2017-10-29 MED ORDER — MIDAZOLAM HCL 2 MG/2ML IJ SOLN
INTRAMUSCULAR | Status: AC
  Administered 2017-10-29: 2 mg via INTRAVENOUS
  Filled 2017-10-29: qty 2

## 2017-10-29 MED ORDER — MIDAZOLAM HCL 5 MG/5ML IJ SOLN
INTRAMUSCULAR | Status: DC | PRN
  Administered 2017-10-29: 2 mg via INTRAVENOUS

## 2017-10-29 MED ORDER — ONDANSETRON HCL 4 MG/2ML IJ SOLN
4.0000 mg | Freq: Four times a day (QID) | INTRAMUSCULAR | Status: DC | PRN
  Administered 2017-10-29: 4 mg via INTRAVENOUS
  Filled 2017-10-29: qty 2

## 2017-10-29 MED ORDER — BUPIVACAINE HCL (PF) 0.25 % IJ SOLN
INTRAMUSCULAR | Status: AC
  Filled 2017-10-29: qty 30

## 2017-10-29 MED ORDER — CEFAZOLIN SODIUM-DEXTROSE 2-4 GM/100ML-% IV SOLN
2.0000 g | INTRAVENOUS | Status: AC
  Administered 2017-10-29: 2 g via INTRAVENOUS
  Filled 2017-10-29: qty 100

## 2017-10-29 MED ORDER — HYDROMORPHONE HCL 1 MG/ML IJ SOLN
0.2500 mg | INTRAMUSCULAR | Status: DC | PRN
  Administered 2017-10-29 (×4): 0.5 mg via INTRAVENOUS

## 2017-10-29 MED ORDER — SUGAMMADEX SODIUM 200 MG/2ML IV SOLN
INTRAVENOUS | Status: AC
  Filled 2017-10-29: qty 2

## 2017-10-29 MED ORDER — 0.9 % SODIUM CHLORIDE (POUR BTL) OPTIME
TOPICAL | Status: DC | PRN
  Administered 2017-10-29: 1000 mL

## 2017-10-29 MED ORDER — SCOPOLAMINE 1 MG/3DAYS TD PT72
MEDICATED_PATCH | TRANSDERMAL | Status: AC
  Filled 2017-10-29: qty 1

## 2017-10-29 MED ORDER — DIPHENHYDRAMINE HCL 50 MG/ML IJ SOLN
INTRAMUSCULAR | Status: AC
  Filled 2017-10-29: qty 1

## 2017-10-29 MED ORDER — BACITRACIN 50000 UNITS IM SOLR
INTRAMUSCULAR | Status: DC | PRN
  Administered 2017-10-29: 09:00:00

## 2017-10-29 MED ORDER — PHENYLEPHRINE HCL 10 MG/ML IJ SOLN
INTRAMUSCULAR | Status: DC | PRN
  Administered 2017-10-29: 80 ug via INTRAVENOUS

## 2017-10-29 MED ORDER — PROMETHAZINE HCL 25 MG/ML IJ SOLN: 6.2500 mg | INTRAMUSCULAR | Status: DC | PRN

## 2017-10-29 MED ORDER — METHOCARBAMOL 1000 MG/10ML IJ SOLN
500.0000 mg | Freq: Four times a day (QID) | INTRAVENOUS | Status: DC | PRN
  Filled 2017-10-29: qty 5

## 2017-10-29 MED ORDER — ACETAMINOPHEN 325 MG PO TABS: 650.0000 mg | ORAL_TABLET | ORAL | Status: DC | PRN

## 2017-10-29 MED ORDER — HYDROCODONE-ACETAMINOPHEN 7.5-325 MG PO TABS
2.0000 | ORAL_TABLET | ORAL | Status: DC | PRN
  Administered 2017-10-29 – 2017-10-30 (×5): 2 via ORAL
  Filled 2017-10-29 (×5): qty 2

## 2017-10-29 MED ORDER — ROCURONIUM BROMIDE 100 MG/10ML IV SOLN
INTRAVENOUS | Status: DC | PRN
  Administered 2017-10-29: 50 mg via INTRAVENOUS
  Administered 2017-10-29: 20 mg via INTRAVENOUS

## 2017-10-29 MED ORDER — ACETAMINOPHEN 650 MG RE SUPP
650.0000 mg | RECTAL | Status: DC | PRN
Start: 2017-10-29 — End: 2017-10-30

## 2017-10-29 MED ORDER — MORPHINE SULFATE (PF) 4 MG/ML IV SOLN
2.0000 mg | INTRAVENOUS | Status: DC | PRN
  Filled 2017-10-29: qty 1

## 2017-10-29 MED ORDER — LACTATED RINGERS IV SOLN
INTRAVENOUS | Status: DC
  Administered 2017-10-29: 09:00:00 via INTRAVENOUS
  Administered 2017-10-29: 10 mL/h via INTRAVENOUS

## 2017-10-29 MED ORDER — SENNA 8.6 MG PO TABS
1.0000 | ORAL_TABLET | Freq: Two times a day (BID) | ORAL | Status: DC
  Administered 2017-10-30: 8.6 mg via ORAL
  Filled 2017-10-29: qty 1

## 2017-10-29 MED ORDER — PROPOFOL 10 MG/ML IV BOLUS
INTRAVENOUS | Status: DC | PRN
  Administered 2017-10-29: 150 mg via INTRAVENOUS

## 2017-10-29 MED ORDER — THROMBIN (RECOMBINANT) 5000 UNITS EX SOLR
CUTANEOUS | Status: AC
  Filled 2017-10-29: qty 10000

## 2017-10-29 MED ORDER — PROPOFOL 10 MG/ML IV BOLUS
INTRAVENOUS | Status: AC
  Filled 2017-10-29: qty 40

## 2017-10-29 MED ORDER — ONDANSETRON HCL 4 MG/2ML IJ SOLN
INTRAMUSCULAR | Status: DC | PRN
  Administered 2017-10-29: 4 mg via INTRAVENOUS

## 2017-10-29 MED ORDER — HEMOSTATIC AGENTS (NO CHARGE) OPTIME
TOPICAL | Status: DC | PRN
  Administered 2017-10-29 (×2): 1 via TOPICAL

## 2017-10-29 MED ORDER — PHENOL 1.4 % MT LIQD: 1.0000 | OROMUCOSAL | Status: DC | PRN

## 2017-10-29 MED ORDER — SUGAMMADEX SODIUM 200 MG/2ML IV SOLN
INTRAVENOUS | Status: DC | PRN
  Administered 2017-10-29: 150 mg via INTRAVENOUS

## 2017-10-29 MED ORDER — MIDAZOLAM HCL 2 MG/2ML IJ SOLN
0.5000 mg | Freq: Once | INTRAMUSCULAR | Status: AC | PRN
  Administered 2017-10-29: 2 mg via INTRAVENOUS

## 2017-10-29 SURGICAL SUPPLY — 57 items
BAG DECANTER FOR FLEXI CONT (MISCELLANEOUS) ×2 IMPLANT
BASKET BONE COLLECTION (BASKET) ×2 IMPLANT
BENZOIN TINCTURE PRP APPL 2/3 (GAUZE/BANDAGES/DRESSINGS) ×2 IMPLANT
BIT DRILL 2.3 12 FIXED (INSTRUMENTS) ×1 IMPLANT
BUR MATCHSTICK NEURO 3.0 LAGG (BURR) ×2 IMPLANT
CAGE CERVICAL 8 (Cage) ×2 IMPLANT
CANISTER SUCT 3000ML PPV (MISCELLANEOUS) ×2 IMPLANT
CARTRIDGE OIL MAESTRO DRILL (MISCELLANEOUS) ×1 IMPLANT
DERMABOND ADVANCED (GAUZE/BANDAGES/DRESSINGS) ×1
DERMABOND ADVANCED .7 DNX12 (GAUZE/BANDAGES/DRESSINGS) ×1 IMPLANT
DIFFUSER DRILL AIR PNEUMATIC (MISCELLANEOUS) ×2 IMPLANT
DRAPE C-ARM 42X72 X-RAY (DRAPES) ×4 IMPLANT
DRAPE LAPAROTOMY 100X72 PEDS (DRAPES) ×2 IMPLANT
DRAPE MICROSCOPE LEICA (MISCELLANEOUS) ×2 IMPLANT
DRAPE POUCH INSTRU U-SHP 10X18 (DRAPES) ×2 IMPLANT
DRILL 12MM (INSTRUMENTS) ×2
DRSG OPSITE POSTOP 3X4 (GAUZE/BANDAGES/DRESSINGS) ×2 IMPLANT
DURAPREP 6ML APPLICATOR 50/CS (WOUND CARE) ×2 IMPLANT
ELECT COATED BLADE 2.86 ST (ELECTRODE) ×2 IMPLANT
ELECT REM PT RETURN 9FT ADLT (ELECTROSURGICAL) ×2
ELECTRODE REM PT RTRN 9FT ADLT (ELECTROSURGICAL) ×1 IMPLANT
GAUZE SPONGE 4X4 16PLY XRAY LF (GAUZE/BANDAGES/DRESSINGS) IMPLANT
GLOVE BIO SURGEON STRL SZ7 (GLOVE) IMPLANT
GLOVE BIO SURGEON STRL SZ8 (GLOVE) IMPLANT
GLOVE BIOGEL PI IND STRL 7.0 (GLOVE) ×4 IMPLANT
GLOVE BIOGEL PI IND STRL 7.5 (GLOVE) ×1 IMPLANT
GLOVE BIOGEL PI IND STRL 8 (GLOVE) ×1 IMPLANT
GLOVE BIOGEL PI INDICATOR 7.0 (GLOVE) ×4
GLOVE BIOGEL PI INDICATOR 7.5 (GLOVE) ×1
GLOVE BIOGEL PI INDICATOR 8 (GLOVE) ×1
GOWN STRL REUS W/ TWL LRG LVL3 (GOWN DISPOSABLE) ×2 IMPLANT
GOWN STRL REUS W/ TWL XL LVL3 (GOWN DISPOSABLE) ×1 IMPLANT
GOWN STRL REUS W/TWL 2XL LVL3 (GOWN DISPOSABLE) IMPLANT
GOWN STRL REUS W/TWL LRG LVL3 (GOWN DISPOSABLE) ×2
GOWN STRL REUS W/TWL XL LVL3 (GOWN DISPOSABLE) ×1
HEMOSTAT POWDER KIT SURGIFOAM (HEMOSTASIS) ×4 IMPLANT
KIT BASIN OR (CUSTOM PROCEDURE TRAY) ×2 IMPLANT
KIT ROOM TURNOVER OR (KITS) ×2 IMPLANT
NEEDLE HYPO 25X1 1.5 SAFETY (NEEDLE) ×2 IMPLANT
NEEDLE SPNL 20GX3.5 QUINCKE YW (NEEDLE) ×2 IMPLANT
NS IRRIG 1000ML POUR BTL (IV SOLUTION) ×2 IMPLANT
OIL CARTRIDGE MAESTRO DRILL (MISCELLANEOUS) ×2
PACK LAMINECTOMY NEURO (CUSTOM PROCEDURE TRAY) ×2 IMPLANT
PAD ARMBOARD 7.5X6 YLW CONV (MISCELLANEOUS) ×6 IMPLANT
PIN DISTRACTION 14MM (PIN) ×4 IMPLANT
PLATE 30MM (Plate) ×2 IMPLANT
RUBBERBAND STERILE (MISCELLANEOUS) ×4 IMPLANT
SCREW 12MM (Screw) ×12 IMPLANT
SPACER SPNL 7D LRG 16X14X8XNS (Cage) ×2 IMPLANT
SPCR SPNL 7D LRG 16X14X8XNS (Cage) ×2 IMPLANT
SPONGE INTESTINAL PEANUT (DISPOSABLE) ×2 IMPLANT
SPONGE SURGIFOAM ABS GEL SZ50 (HEMOSTASIS) ×2 IMPLANT
STRIP CLOSURE SKIN 1/2X4 (GAUZE/BANDAGES/DRESSINGS) ×2 IMPLANT
SUT VIC AB 3-0 SH 8-18 (SUTURE) ×4 IMPLANT
TOWEL GREEN STERILE (TOWEL DISPOSABLE) ×2 IMPLANT
TOWEL GREEN STERILE FF (TOWEL DISPOSABLE) ×2 IMPLANT
WATER STERILE IRR 1000ML POUR (IV SOLUTION) ×2 IMPLANT

## 2017-10-29 NOTE — Transfer of Care (Signed)
Immediate Anesthesia Transfer of Care Note  Patient: Alexis Bernard  Procedure(s) Performed: Anterior Cervical Decompression Fusion - Cervical four-Cervical five - Cervical five-Cervical six (N/A )  Patient Location: PACU  Anesthesia Type:General  Level of Consciousness: awake, alert , oriented and sedated  Airway & Oxygen Therapy: Patient Spontanous Breathing and Patient connected to nasal cannula oxygen  Post-op Assessment: Report given to RN, Post -op Vital signs reviewed and stable and Patient moving all extremities  Post vital signs: Reviewed and stable  Last Vitals:  Vitals:   10/29/17 0841  BP: 105/66  Pulse: 70  Resp: 20  Temp: 36.6 C  SpO2: 100%    Last Pain:  Vitals:   10/29/17 0841  TempSrc: Oral         Complications: No apparent anesthesia complications

## 2017-10-29 NOTE — Op Note (Signed)
10/29/2017  12:31 PM  PATIENT:  Alexis Bernard  41 y.o. female  PRE-OPERATIVE DIAGNOSIS:  Cervical spinal stenosis C4-5 and C5-6 with signal change of the spinal cord and cervical spondylitic myelopathy  POST-OPERATIVE DIAGNOSIS:  same  PROCEDURE:  1. Decompressive anterior cervical discectomy C4-5 and C5-6, 2. Anterior cervical arthrodesis C4-5 and C5-6 utilizing a peek interbody cage packed with locally harvested morcellized autologous bone graft, 3. Anterior cervical plating C4-5 C5-6 utilizing a atec plate  SURGEON:  Sherley Bounds, MD  ASSISTANTS: Meyran FNP  ANESTHESIA:   General  EBL: 200 ml  Total I/O In: -  Out: 200 [Blood:200]  BLOOD ADMINISTERED: none  DRAINS: none  SPECIMEN:  none  INDICATION FOR PROCEDURE: This patient presented with neck pain and arm pain with numbness and tingling in the hands. Imaging showed spinal stenosis C4-5 and C5-6 with cord compression and signal change in the cord at C4-5. The patient tried conservative measures without relief. Pain was debilitating. Recommended ACDF with plating. Patient understood the risks, benefits, and alternatives and potential outcomes and wished to proceed.  PROCEDURE DETAILS: Patient was brought to the operating room placed under general endotracheal anesthesia. Patient was placed in the supine position on the operating room table. The neck was prepped with Duraprep and draped in a sterile fashion.   Three cc of local anesthesia was injected and a transverse incision was made on the right side of the neck.  Dissection was carried down thru the subcutaneous tissue and the platysma was  elevated, opened, and undermined with Metzenbaum scissors.  Dissection was then carried out thru an avascular plane leaving the sternocleidomastoid carotid artery and jugular vein laterally and the trachea and esophagus medially. The ventral aspect of the vertebral column was identified and a localizing x-ray was taken. The C4-5  level was identified. The longus colli muscles were then elevated and the retractor was placed. The annulus at C4-5 and C5-6 was incised and the disc space entered. Discectomy was performed with micro-curettes and pituitary rongeurs. I then used the high-speed drill to drill the endplates down to the level of the posterior longitudinal ligament. The drill shavings were saved in a mucous trap for later arthrodesis. The operating microscope was draped and brought into the field provided additional magnification, illumination and visualization. Discectomy was continued posteriorly thru the disc space. Posterior longitudinal ligament was opened with a nerve hook, and then removed along with disc herniation and osteophytes, decompressing the spinal canal and thecal sac. We then continued to remove osteophytic overgrowth and disc material decompressing the neural foramina and exiting nerve roots bilaterally. The scope was angled up and down to help decompress and undercut the vertebral bodies. Once the decompression was completed we could pass a nerve hook circumferentially to assure adequate decompression in the midline and in the neural foramina. So by both visualization and palpation we felt we had an adequate decompression of the neural elements. We then measured the height of the intravertebral disc space and selected an 8 millimeter Peek interbody cage packed with autograft for each level. It was then gently positioned in the intravertebral disc space(s) and countersunk. I then used a Alphatec plate and placed variable angle screws into the vertebral bodies of each level and locked them into position. The wound was irrigated with bacitracin solution, checked for hemostasis which was established and confirmed. Once meticulous hemostasis was achieved, we then proceeded with closure. The platysma was closed with interrupted 3-0 undyed Vicryl suture, the subcuticular layer was  closed with interrupted 3-0 undyed Vicryl  suture. The skin edges were approximated with steristrips. The drapes were removed. A sterile dressing was applied. The patient was then awakened from general anesthesia and transferred to the recovery room in stable condition. At the end of the procedure all sponge, needle and instrument counts were correct.   PLAN OF CARE: Admit for overnight observation  PATIENT DISPOSITION:  PACU - hemodynamically stable.   Delay start of Pharmacological VTE agent (>24hrs) due to surgical blood loss or risk of bleeding:  yes

## 2017-10-29 NOTE — Anesthesia Postprocedure Evaluation (Signed)
Anesthesia Post Note  Patient: Alexis Bernard  Procedure(s) Performed: Anterior Cervical Decompression Fusion - Cervical four-Cervical five - Cervical five-Cervical six (N/A )     Patient location during evaluation: PACU Anesthesia Type: General Level of consciousness: awake and alert, oriented and patient cooperative Pain management: pain level controlled (pain improving) Vital Signs Assessment: post-procedure vital signs reviewed and stable Respiratory status: spontaneous breathing, nonlabored ventilation and respiratory function stable Cardiovascular status: blood pressure returned to baseline and stable Postop Assessment: no apparent nausea or vomiting Anesthetic complications: no    Last Vitals:  Vitals:   10/29/17 1415 10/29/17 1430  BP: (!) 94/56 (!) 100/56  Pulse: 79 78  Resp: (!) 22 17  Temp:  36.7 C  SpO2: 99% 97%    Last Pain:  Vitals:   10/29/17 1400  TempSrc:   PainSc: 7                  Sair Faulcon,E. Daleiza Bacchi

## 2017-10-29 NOTE — H&P (Signed)
Subjective:   Patient is a 41 y.o. female admitted for cervical stenosis. The patient first presented to me with complaints of neck pain, shooting pains in the arm(s) and numbness of the arm(s). Onset of symptoms was several months ago. The pain is described as aching and occurs all day. The pain is rated severe, and is located in the neck and radiates to the arms. The symptoms have been progressive. Symptoms are exacerbated by none, and are relieved by none.  Previous work up includes MRI of cervical spine, results: spinal stenosis.  Past Medical History:  Diagnosis Date  . Arthritis   . Asthma   . Complication of anesthesia   . Dysrhythmia    as a child  . Headache    migraines  . Hypoglycemia   . Murmur    pt denies currently, as a child  . PONV (postoperative nausea and vomiting)   . Sjogren's disease ([place])   . Spinal stenosis     Past Surgical History:  Procedure Laterality Date  . FRACTURE SURGERY     anklex4  . laproscopic surgery to evaluate endometriosis      Allergies  Allergen Reactions  . Latex Itching and Rash    Social History   Tobacco Use  . Smoking status: Never Smoker  . Smokeless tobacco: Never Used  Substance Use Topics  . Alcohol use: Yes    Comment: occ     History reviewed. No pertinent family history. Prior to Admission medications   Medication Sig Start Date End Date Taking? Authorizing Provider  aspirin-acetaminophen-caffeine (EXCEDRIN MIGRAINE) [phone] MG tablet Take 2 tablets by mouth 2 (two) times daily as needed for migraine.    Yes [provider]  Aspirin-Acetaminophen-Caffeine (PAMPRIN MAX PO) Take 3 tablets by mouth daily as needed (CRAMPS).   Yes [provider]  cyclobenzaprine (FLEXERIL) 10 MG tablet Take 10 mg by mouth daily as needed for muscle spasms.   Yes [provider]  ergocalciferol (VITAMIN D2) 50000 units capsule Take 50,000 Units by mouth once a week. Mondays   Yes [provider]   ibuprofen (ADVIL,MOTRIN) 200 MG tablet Take 600 mg by mouth every 8 (eight) hours as needed for mild pain.   Yes [provider]  meloxicam (MOBIC) 15 MG tablet Take 15 mg by mouth daily as needed for pain.  09/21/17  Yes [provider]  Prenatal MV-Min-FA-Omega-3 (PRENATAL GUMMIES/DHA & FA PO) Take 2 each by mouth daily.   Yes [provider]  albuterol (PROVENTIL HFA;VENTOLIN HFA) 108 (90 Base) MCG/ACT inhaler Inhale 2 puffs into the lungs every 6 (six) hours as needed for wheezing or shortness of breath.    [provider]  naproxen sodium (ALEVE) 220 MG tablet Take 440 mg by mouth 2 (two) times daily as needed (PAIN).    [provider]  SUMAtriptan (IMITREX) 50 MG tablet TAKE 1 TABLET BY MOUTH  EVRY 2 HOURS AS NEEDED MIGRAINE 08/22/17   [provider]     Review of Systems  Positive ROS: neg  All other systems have been reviewed and were otherwise negative with the exception of those mentioned in the HPI and as above.  Objective: Vital signs in last 24 hours: Temp:  [97.9 F (36.6 C)] 97.9 F (36.6 C) (12/20 0841) Pulse Rate:  [70] 70 (12/20 0841) Resp:  [20] 20 (12/20 0841) BP: (105)/(66) 105/66 (12/20 0841) SpO2:  [100 %] 100 % (12/20 0841)  General Appearance: Alert, cooperative, no distress, appears  stated age Head: Normocephalic, without obvious abnormality, atraumatic Eyes: PERRL, conjunctiva/corneas clear, EOM's intact      Neck: Supple, symmetrical, trachea midline, Back: Symmetric, no curvature, ROM normal, no CVA tenderness Lungs:  respirations unlabored Heart: Regular rate and rhythm Abdomen: Soft, non-tender Extremities: Extremities normal, atraumatic, no cyanosis or edema Pulses: 2+ and symmetric all extremities Skin: Skin color, texture, turgor normal, no rashes or lesions  NEUROLOGIC:  Mental status: Alert and oriented x4, no aphasia, good attention span, fund of knowledge and memory  Motor Exam -  grossly normal Sensory Exam - grossly normal Reflexes: 1+ Coordination - grossly normal Gait - grossly normal Balance - grossly normal Cranial Nerves: I: smell Not tested  II: visual acuity  OS: nl    OD: nl  II: visual fields Full to confrontation  II: pupils Equal, round, reactive to light  III,VII: ptosis None  III,IV,VI: extraocular muscles  Full ROM  V: mastication Normal  V: facial light touch sensation  Normal  V,VII: corneal reflex  Present  VII: facial muscle function - upper  Normal  VII: facial muscle function - lower Normal  VIII: hearing Not tested  IX: soft palate elevation  Normal  IX,X: gag reflex Present  XI: trapezius strength  5/5  XI: sternocleidomastoid strength 5/5  XI: neck flexion strength  5/5  XII: tongue strength  Normal    Data Review Lab Results  Component Value Date   WBC 6.4 10/27/2017   HGB 11.9 (L) 10/27/2017   HCT 38.2 10/27/2017   MCV 84.5 10/27/2017   PLT 317 10/27/2017   Lab Results  Component Value Date   NA 136 10/27/2017   K 4.2 10/27/2017   CL 106 10/27/2017   CO2 23 10/27/2017   BUN 13 10/27/2017   CREATININE 0.81 10/27/2017   GLUCOSE 100 (H) 10/27/2017   Lab Results  Component Value Date   INR 1.06 10/27/2017    Assessment:   Cervical neck pain with herniated nucleus pulposus/ spondylosis/ stenosis at C45, C5-6. Patient has failed conservative therapy. Planned surgery : ACDF C4-5 C5-6  Plan:   I explained the condition and procedure to the patient and answered any questions.  Patient wishes to proceed with procedure as planned. Understands risks/ benefits/ and expected or typical outcomes.  JONES,DAVID S 10/29/2017 9:13 AM

## 2017-10-29 NOTE — Anesthesia Procedure Notes (Signed)
Procedure Name: Intubation Date/Time: 10/29/2017 8:53 AM Performed by: Scheryl Darter, CRNA Pre-anesthesia Checklist: Patient identified, Emergency Drugs available, Suction available and Patient being monitored Patient Re-evaluated:Patient Re-evaluated prior to induction Oxygen Delivery Method: Circle System Utilized Preoxygenation: Pre-oxygenation with 100% oxygen Induction Type: IV induction Ventilation: Mask ventilation without difficulty Laryngoscope Size: Mac and 3 Grade View: Grade I Tube type: Oral Tube size: 7.0 mm Number of attempts: 1 Airway Equipment and Method: Stylet and Oral airway Placement Confirmation: ETT inserted through vocal cords under direct vision,  positive ETCO2 and breath sounds checked- equal and bilateral Secured at: 22 cm Tube secured with: Tape Dental Injury: Teeth and Oropharynx as per pre-operative assessment

## 2017-10-30 ENCOUNTER — Other Ambulatory Visit: Payer: Self-pay

## 2017-10-30 ENCOUNTER — Encounter (HOSPITAL_COMMUNITY): Payer: Self-pay | Admitting: Neurological Surgery

## 2017-10-30 DIAGNOSIS — Z7982 Long term (current) use of aspirin: Secondary | ICD-10-CM | POA: Diagnosis not present

## 2017-10-30 DIAGNOSIS — M50022 Cervical disc disorder at C5-C6 level with myelopathy: Secondary | ICD-10-CM | POA: Diagnosis not present

## 2017-10-30 DIAGNOSIS — J449 Chronic obstructive pulmonary disease, unspecified: Secondary | ICD-10-CM | POA: Diagnosis not present

## 2017-10-30 DIAGNOSIS — M4802 Spinal stenosis, cervical region: Secondary | ICD-10-CM | POA: Diagnosis not present

## 2017-10-30 DIAGNOSIS — Z91040 Latex allergy status: Secondary | ICD-10-CM | POA: Diagnosis not present

## 2017-10-30 DIAGNOSIS — Z79899 Other long term (current) drug therapy: Secondary | ICD-10-CM | POA: Diagnosis not present

## 2017-10-30 DIAGNOSIS — M1990 Unspecified osteoarthritis, unspecified site: Secondary | ICD-10-CM | POA: Diagnosis not present

## 2017-10-30 DIAGNOSIS — M4712 Other spondylosis with myelopathy, cervical region: Secondary | ICD-10-CM | POA: Diagnosis not present

## 2017-10-30 MED ORDER — CYCLOBENZAPRINE HCL 10 MG PO TABS: 10.0000 mg | ORAL_TABLET | Freq: Every day | ORAL | 0 refills | Status: DC | PRN

## 2017-10-30 MED ORDER — HYDROCODONE-ACETAMINOPHEN 7.5-325 MG PO TABS: 2.0000 | ORAL_TABLET | ORAL | 0 refills | Status: DC | PRN

## 2017-10-30 NOTE — Progress Notes (Signed)
Patient is discharged from room 3C09 at this time. Alert and in stable condition. IV site d/c'd and instructions read to patient and spouse with understanding verbalized. Left unit via wheelchair with all belongings at side. 

## 2017-10-30 NOTE — Discharge Summary (Signed)
Physician Discharge Summary  Patient ID: Alexis Bernard MRN: [id number] DOB/AGE: 03-11-1976 41 y.o.  Admit date: 10/29/2017 Discharge date: 10/30/2017  Admission Diagnoses: Cervical spinal stenosis C4-5 and C5-6 with signal change of the spinal cord and cervical spondylitic myelopathy    Discharge Diagnoses: same as admitting   Discharged Condition: good  Hospital Course: The patient was admitted on 10/29/2017 and taken to the operating room where the patient underwent acdf C4-5, C5-6. The patient tolerated the procedure well and was taken to the recovery room and then to the floor in stable condition. The hospital course was routine. There were no complications. The wound remained clean dry and intact. Pt had appropriate neck soreness. No complaints of new pain or new N/T/W. The patient remained afebrile with stable vital signs, and tolerated a regular diet. The patient continued to increase activities, and pain was well controlled with oral pain medications.   Consults: None  Significant Diagnostic Studies:  Results for orders placed or performed during the hospital encounter of 10/27/17  Surgical pcr screen  Result Value Ref Range   MRSA, PCR NEGATIVE NEGATIVE   Staphylococcus aureus NEGATIVE NEGATIVE  hCG, serum, qualitative  Result Value Ref Range   Preg, Serum NEGATIVE NEGATIVE  Basic metabolic panel  Result Value Ref Range   Sodium 136 135 - 145 mmol/L   Potassium 4.2 3.5 - 5.1 mmol/L   Chloride 106 101 - 111 mmol/L   CO2 23 22 - 32 mmol/L   Glucose, Bld 100 (H) 65 - 99 mg/dL   BUN 13 6 - 20 mg/dL   Creatinine, Ser 0.81 0.44 - 1.00 mg/dL   Calcium 9.3 8.9 - 10.3 mg/dL   GFR calc non Af Amer >60 >60 mL/min   GFR calc Af Amer >60 >60 mL/min   Anion gap 7 5 - 15  Protime-INR  Result Value Ref Range   Prothrombin Time 13.7 11.4 - 15.2 seconds   INR 1.06   CBC with Differential/Platelet  Result Value Ref Range   WBC 6.4 4.0 - 10.5 K/uL   RBC 4.52 3.87 -  5.11 MIL/uL   Hemoglobin 11.9 (L) 12.0 - 15.0 g/dL   HCT 38.2 36.0 - 46.0 %   MCV 84.5 78.0 - 100.0 fL   MCH 26.3 26.0 - 34.0 pg   MCHC 31.2 30.0 - 36.0 g/dL   RDW 14.5 11.5 - 15.5 %   Platelets 317 150 - 400 K/uL   Neutrophils Relative % 58 %   Neutro Abs 3.7 1.7 - 7.7 K/uL   Lymphocytes Relative 32 %   Lymphs Abs 2.0 0.7 - 4.0 K/uL   Monocytes Relative 8 %   Monocytes Absolute 0.5 0.1 - 1.0 K/uL   Eosinophils Relative 2 %   Eosinophils Absolute 0.1 0.0 - 0.7 K/uL   Basophils Relative 0 %   Basophils Absolute 0.0 0.0 - 0.1 K/uL  No blood products  Result Value Ref Range   Transfuse no blood products      TRANSFUSE NO BLOOD PRODUCTS, VERIFIED BY EMILY FULLER RN    Dg Cervical Spine 1 View  Result Date: 10/29/2017 CLINICAL DATA:  Intraoperative imaging for C4-6 ACDF. EXAM: DG C-ARM 61-120 MIN; DG CERVICAL SPINE - 1 VIEW COMPARISON:  MRI cervical spine 09/19/2017. FINDINGS: Single intraoperative fluoroscopic spot view of the cervical spine in the lateral projection is provided. Images show anterior plate and screws interbody spacers in place from C4-C6. No acute abnormality is identified. IMPRESSION: Intraoperative imaging demonstrating C4-6 ACDF.  No acute finding. Electronically Signed   By: Inge Rise M.D.   On: 10/29/2017 12:46   Dg C-arm 1-60 Min  Result Date: 10/29/2017 CLINICAL DATA:  Intraoperative imaging for C4-6 ACDF. EXAM: DG C-ARM 61-120 MIN; DG CERVICAL SPINE - 1 VIEW COMPARISON:  MRI cervical spine 09/19/2017. FINDINGS: Single intraoperative fluoroscopic spot view of the cervical spine in the lateral projection is provided. Images show anterior plate and screws interbody spacers in place from C4-C6. No acute abnormality is identified. IMPRESSION: Intraoperative imaging demonstrating C4-6 ACDF.  No acute finding. Electronically Signed   By: Inge Rise M.D.   On: 10/29/2017 12:46    Antibiotics:  Anti-infectives (From admission, onward)   Start      Dose/Rate Route Frequency Ordered Stop   10/29/17 1730  ceFAZolin (ANCEF) IVPB 2g/100 mL premix     2 g 200 mL/hr over 30 Minutes Intravenous Every 8 hours 10/29/17 1241 10/30/17 0056   10/29/17 0915  bacitracin 50,000 Units in sodium chloride irrigation 0.9 % 500 mL irrigation  Status:  Discontinued       As needed 10/29/17 1118 10/29/17 1235   10/29/17 0800  ceFAZolin (ANCEF) IVPB 2g/100 mL premix     2 g 200 mL/hr over 30 Minutes Intravenous To [facility] 10/29/17 0743 10/29/17 0954      Discharge Exam: Blood pressure 96/62, pulse 62, temperature 98.5 F (36.9 C), resp. rate 16, height 5\' 2"  (1.575 m), weight 73.9 kg (163 lb), last menstrual period 10/09/2017, SpO2 100 %. Neurologic: Grossly normal Ambulating and voiding well  Discharge Medications:   Allergies as of 10/30/2017      Reactions   Latex Itching, Rash      Medication List    TAKE these medications   albuterol 108 (90 Base) MCG/ACT inhaler Commonly known as:  PROVENTIL HFA;VENTOLIN HFA Inhale 2 puffs into the lungs every 6 (six) hours as needed for wheezing or shortness of breath.   aspirin-acetaminophen-caffeine 250-250-65 MG tablet Commonly known as:  EXCEDRIN MIGRAINE Take 2 tablets by mouth 2 (two) times daily as needed for migraine.   PAMPRIN MAX PO Take 3 tablets by mouth daily as needed (CRAMPS).   cyclobenzaprine 10 MG tablet Commonly known as:  FLEXERIL Take 1 tablet (10 mg total) by mouth daily as needed for muscle spasms.   ergocalciferol 50000 units capsule Commonly known as:  VITAMIN D2 Take 50,000 Units by mouth once a week. Mondays   HYDROcodone-acetaminophen 7.5-325 MG tablet Commonly known as:  NORCO Take 2 tablets by mouth every 4 (four) hours as needed for severe pain ((score 7 to 10)).   ibuprofen 200 MG tablet Commonly known as:  ADVIL,MOTRIN Take 600 mg by mouth every 8 (eight) hours as needed for mild pain.   meloxicam 15 MG tablet Commonly known as:  MOBIC Take 15  mg by mouth daily as needed for pain.   naproxen sodium 220 MG tablet Commonly known as:  ALEVE Take 440 mg by mouth 2 (two) times daily as needed (PAIN).   PRENATAL GUMMIES/DHA & FA PO Take 2 each by mouth daily.   SUMAtriptan 50 MG tablet Commonly known as:  IMITREX TAKE 1 TABLET BY MOUTH  EVRY 2 HOURS AS NEEDED MIGRAINE       Disposition: home   Final Dx: same as admitting  Discharge Instructions     Remove dressing in 72 hours   Complete by:  As directed    Call MD for:  difficulty breathing, headache or  visual disturbances   Complete by:  As directed    Call MD for:  extreme fatigue   Complete by:  As directed    Call MD for:  hives   Complete by:  As directed    Call MD for:  persistant dizziness or light-headedness   Complete by:  As directed    Call MD for:  persistant nausea and vomiting   Complete by:  As directed    Call MD for:  redness, tenderness, or signs of infection (pain, swelling, redness, odor or green/yellow discharge around incision site)   Complete by:  As directed    Call MD for:  severe uncontrolled pain   Complete by:  As directed    Call MD for:  temperature >100.4   Complete by:  As directed    Diet - low sodium heart healthy   Complete by:  As directed    Driving Restrictions   Complete by:  As directed    No driving 2 weeks   Increase activity slowly   Complete by:  As directed    Lifting restrictions   Complete by:  As directed    Nothing heavier than 8 lbs         Signed: Ocie Cornfield Kenedie Dirocco 10/30/2017, 10:39 AM

## 2017-12-28 DIAGNOSIS — M542 Cervicalgia: Secondary | ICD-10-CM | POA: Diagnosis not present

## 2018-01-20 ENCOUNTER — Other Ambulatory Visit: Payer: Self-pay | Admitting: Internal Medicine

## 2018-01-20 DIAGNOSIS — D649 Anemia, unspecified: Secondary | ICD-10-CM | POA: Diagnosis not present

## 2018-01-20 DIAGNOSIS — K529 Noninfective gastroenteritis and colitis, unspecified: Secondary | ICD-10-CM | POA: Diagnosis not present

## 2018-01-20 DIAGNOSIS — R1010 Upper abdominal pain, unspecified: Secondary | ICD-10-CM | POA: Diagnosis not present

## 2018-01-20 DIAGNOSIS — Z806 Family history of leukemia: Secondary | ICD-10-CM | POA: Diagnosis not present

## 2018-01-20 DIAGNOSIS — R1013 Epigastric pain: Secondary | ICD-10-CM | POA: Diagnosis not present

## 2018-01-26 ENCOUNTER — Ambulatory Visit: Payer: BLUE CROSS/BLUE SHIELD | Admitting: Neurology

## 2018-01-27 DIAGNOSIS — J069 Acute upper respiratory infection, unspecified: Secondary | ICD-10-CM | POA: Diagnosis not present

## 2018-01-27 DIAGNOSIS — R05 Cough: Secondary | ICD-10-CM | POA: Diagnosis not present

## 2018-02-02 ENCOUNTER — Other Ambulatory Visit (HOSPITAL_BASED_OUTPATIENT_CLINIC_OR_DEPARTMENT_OTHER): Payer: Self-pay | Admitting: Internal Medicine

## 2018-02-02 ENCOUNTER — Other Ambulatory Visit: Payer: BLUE CROSS/BLUE SHIELD

## 2018-02-02 DIAGNOSIS — B349 Viral infection, unspecified: Secondary | ICD-10-CM | POA: Diagnosis not present

## 2018-02-02 DIAGNOSIS — R1010 Upper abdominal pain, unspecified: Secondary | ICD-10-CM

## 2018-02-06 ENCOUNTER — Ambulatory Visit (HOSPITAL_BASED_OUTPATIENT_CLINIC_OR_DEPARTMENT_OTHER)
Admission: RE | Admit: 2018-02-06 | Discharge: 2018-02-06 | Disposition: A | Discharge status: Home / Self Care | Payer: BLUE CROSS/BLUE SHIELD | Source: Ambulatory Visit | Attending: Internal Medicine | Admitting: Internal Medicine

## 2018-02-06 DIAGNOSIS — R1010 Upper abdominal pain, unspecified: Secondary | ICD-10-CM | POA: Insufficient documentation

## 2018-02-06 DIAGNOSIS — R1084 Generalized abdominal pain: Secondary | ICD-10-CM | POA: Diagnosis not present

## 2018-02-12 ENCOUNTER — Other Ambulatory Visit: Payer: BLUE CROSS/BLUE SHIELD

## 2018-02-12 DIAGNOSIS — R1010 Upper abdominal pain, unspecified: Secondary | ICD-10-CM | POA: Diagnosis not present

## 2018-02-12 DIAGNOSIS — D649 Anemia, unspecified: Secondary | ICD-10-CM | POA: Diagnosis not present

## 2018-02-12 DIAGNOSIS — G43009 Migraine without aura, not intractable, without status migrainosus: Secondary | ICD-10-CM | POA: Diagnosis not present

## 2018-02-25 DIAGNOSIS — R194 Change in bowel habit: Secondary | ICD-10-CM | POA: Diagnosis not present

## 2018-03-25 DIAGNOSIS — N939 Abnormal uterine and vaginal bleeding, unspecified: Secondary | ICD-10-CM | POA: Diagnosis not present

## 2018-03-25 DIAGNOSIS — N920 Excessive and frequent menstruation with regular cycle: Secondary | ICD-10-CM | POA: Diagnosis not present

## 2018-06-25 DIAGNOSIS — H35423 Microcystoid degeneration of retina, bilateral: Secondary | ICD-10-CM | POA: Diagnosis not present

## 2018-06-25 DIAGNOSIS — M3501 Sicca syndrome with keratoconjunctivitis: Secondary | ICD-10-CM | POA: Diagnosis not present

## 2018-07-09 DIAGNOSIS — M79672 Pain in left foot: Secondary | ICD-10-CM | POA: Diagnosis not present

## 2018-07-09 DIAGNOSIS — M25572 Pain in left ankle and joints of left foot: Secondary | ICD-10-CM | POA: Diagnosis not present

## 2018-08-13 ENCOUNTER — Emergency Department (HOSPITAL_BASED_OUTPATIENT_CLINIC_OR_DEPARTMENT_OTHER)
Admission: EM | Admit: 2018-08-13 | Discharge: 2018-08-13 | Disposition: A | Discharge status: Home / Self Care | Payer: BLUE CROSS/BLUE SHIELD | Attending: Emergency Medicine | Admitting: Emergency Medicine

## 2018-08-13 ENCOUNTER — Encounter (HOSPITAL_BASED_OUTPATIENT_CLINIC_OR_DEPARTMENT_OTHER): Payer: Self-pay | Admitting: *Deleted

## 2018-08-13 ENCOUNTER — Emergency Department (HOSPITAL_BASED_OUTPATIENT_CLINIC_OR_DEPARTMENT_OTHER): Payer: BLUE CROSS/BLUE SHIELD

## 2018-08-13 ENCOUNTER — Other Ambulatory Visit: Payer: Self-pay

## 2018-08-13 DIAGNOSIS — Y929 Unspecified place or not applicable: Secondary | ICD-10-CM | POA: Diagnosis not present

## 2018-08-13 DIAGNOSIS — Y999 Unspecified external cause status: Secondary | ICD-10-CM | POA: Diagnosis not present

## 2018-08-13 DIAGNOSIS — M65342 Trigger finger, left ring finger: Secondary | ICD-10-CM | POA: Diagnosis not present

## 2018-08-13 DIAGNOSIS — J45909 Unspecified asthma, uncomplicated: Secondary | ICD-10-CM | POA: Diagnosis not present

## 2018-08-13 DIAGNOSIS — Y939 Activity, unspecified: Secondary | ICD-10-CM | POA: Insufficient documentation

## 2018-08-13 DIAGNOSIS — X58XXXA Exposure to other specified factors, initial encounter: Secondary | ICD-10-CM | POA: Insufficient documentation

## 2018-08-13 DIAGNOSIS — Z79899 Other long term (current) drug therapy: Secondary | ICD-10-CM | POA: Diagnosis not present

## 2018-08-13 DIAGNOSIS — S6992XA Unspecified injury of left wrist, hand and finger(s), initial encounter: Secondary | ICD-10-CM | POA: Diagnosis present

## 2018-08-13 MED ORDER — NAPROXEN 500 MG PO TABS: 500.0000 mg | ORAL_TABLET | Freq: Two times a day (BID) | ORAL | 0 refills | Status: AC

## 2018-08-13 MED ORDER — HYDROCODONE-ACETAMINOPHEN 5-325 MG PO TABS
1.0000 | ORAL_TABLET | Freq: Once | ORAL | Status: AC
  Administered 2018-08-13: 1 via ORAL
  Filled 2018-08-13: qty 1

## 2018-08-13 MED ORDER — BUPIVACAINE HCL 0.5 % IJ SOLN
10.0000 mL | Freq: Once | INTRAMUSCULAR | Status: AC
  Administered 2018-08-13: 10 mL
  Filled 2018-08-13: qty 1

## 2018-08-13 NOTE — ED Notes (Signed)
ED Provider at bedside. 

## 2018-08-13 NOTE — Discharge Instructions (Addendum)
Take Naprosyn as directed for pain.  As we discussed, you will need to follow-up with referred hand surgeon.  Call his office to make an appointment.  Monitor closely for signs of infection.  If the finger becomes red, hot, more swollen, you start running fever or pain gets worse, return to emergency department for further evaluation.

## 2018-08-13 NOTE — ED Triage Notes (Signed)
Left hand pain and swelling since yesterday. No known injury.

## 2018-08-13 NOTE — ED Provider Notes (Addendum)
[facility]PART[facility] Provider Note   CSN: [id number] Arrival date & time: 08/13/18  1517     History   Chief Complaint Chief Complaint  Patient presents with  . Hand Pain    HPI Alexis Bernard is a 42 y.o. female with PMH/o Asthma, Sjogren's who presents for evaluation of 2 days of left hand pain and swelling, more particularly at the the left 4th digit. She denies any preceding trauma, injury, or fall. She states that she noticed some slight swelling to the area and had to take her wedding ring as a swelling.  She states that most the pain is at the base of the finger.  She has not had any overlying warmth or erythema.  She states that the pain is causing her whole hand and arm to ache.  Patient reports that she took Tylenol and ibuprofen with no improvement.  Patient reports difficulty moving hand secondary to pain.  Patient has not had any fevers.  She denies any numbness/weakness.  The history is provided by the patient.    Past Medical History:  Diagnosis Date  . Arthritis   . Asthma   . Complication of anesthesia   . Dysrhythmia    as a child  . Headache    migraines  . Hypoglycemia   . Murmur    pt denies currently, as a child  . PONV (postoperative nausea and vomiting)   . Sjogren's disease ([place])   . Spinal stenosis     Patient Active Problem List   Diagnosis Date Noted  . Cervical vertebral fusion 10/29/2017    Past Surgical History:  Procedure Laterality Date  . ANTERIOR CERVICAL DECOMP/DISCECTOMY FUSION N/A 10/29/2017   Procedure: Anterior Cervical Decompression Fusion - Cervical four-Cervical five - Cervical five-Cervical six;  Surgeon: Eustace Moore, MD;  Location: [facility];  Service: Neurosurgery;  Laterality: N/A;  . FRACTURE SURGERY     anklex4  . laproscopic surgery to evaluate endometriosis       OB History   None      Home Medications    Prior to Admission medications   Medication Sig Start Date End Date  Taking? Authorizing Provider  albuterol (PROVENTIL HFA;VENTOLIN HFA) 108 (90 Base) MCG/ACT inhaler Inhale 2 puffs into the lungs every 6 (six) hours as needed for wheezing or shortness of breath.   Yes [provider]  cyclobenzaprine (FLEXERIL) 10 MG tablet Take 1 tablet (10 mg total) by mouth daily as needed for muscle spasms. 10/30/17  Yes Meyran, Ocie Cornfield, NP  SUMAtriptan (IMITREX) 50 MG tablet TAKE 1 TABLET BY MOUTH  EVRY 2 HOURS AS NEEDED MIGRAINE 08/22/17  Yes [provider]  aspirin-acetaminophen-caffeine (EXCEDRIN MIGRAINE) [phone] MG tablet Take 2 tablets by mouth 2 (two) times daily as needed for migraine.     [provider]  Aspirin-Acetaminophen-Caffeine (PAMPRIN MAX PO) Take 3 tablets by mouth daily as needed (CRAMPS).    [provider]  ergocalciferol (VITAMIN D2) 50000 units capsule Take 50,000 Units by mouth once a week. Mondays    [provider]  HYDROcodone-acetaminophen (NORCO) 7.5-325 MG tablet Take 2 tablets by mouth every 4 (four) hours as needed for severe pain ((score 7 to 10)). 10/30/17   Meyran, Ocie Cornfield, NP  ibuprofen (ADVIL,MOTRIN) 200 MG tablet Take 600 mg by mouth every 8 (eight) hours as needed for mild pain.    [provider]  meloxicam (MOBIC) 15 MG tablet Take 15 mg by mouth daily  as needed for pain.  09/21/17   [provider]  naproxen (NAPROSYN) 500 MG tablet Take 1 tablet (500 mg total) by mouth 2 (two) times daily for 7 days. 08/13/18 08/20/18  [place] Lanius A, PA-C  naproxen sodium (ALEVE) 220 MG tablet Take 440 mg by mouth 2 (two) times daily as needed (PAIN).    [provider]  Prenatal MV-Min-FA-Omega-3 (PRENATAL GUMMIES/DHA & FA PO) Take 2 each by mouth daily.    [provider]    Family History No family history on file.  Social History Social History   Tobacco Use  . Smoking status: Never Smoker  . Smokeless tobacco: Never Used  Substance Use  Topics  . Alcohol use: Yes    Comment: occ   . Drug use: No     Allergies   Latex   Review of Systems Review of Systems  Constitutional: Negative for fever.  Musculoskeletal:       Left hand pain and swelling  Skin: Negative for color change.  Neurological: Negative for weakness and numbness.  All other systems reviewed and are negative.    Physical Exam Updated Vital Signs BP (!) 141/91 (BP Location: Right Arm)   Pulse 80   Temp 98.3 F (36.8 C) (Oral)   Resp 18   Ht 5\' 2"  (1.575 m)   Wt 72.6 kg   LMP 07/20/2018   SpO2 100%   BMI 29.26 kg/m   Physical Exam  Constitutional: She appears well-developed and well-nourished.  HENT:  Head: Normocephalic and atraumatic.  Eyes: Conjunctivae and EOM are normal. Right eye exhibits no discharge. Left eye exhibits no discharge. No scleral icterus.  Cardiovascular:  Pulses:      Radial pulses are 2+ on the right side, and 2+ on the left side.  Pulmonary/Chest: Effort normal.  Musculoskeletal:  Palpation noted to the volar aspect of the left fourth MCP with palpable nodule.  There is no overlying warmth, erythema.  Left fourth digit is slightly contracted in a flexed position.  Pain elicited with extension.  No overlying warmth, erythema to left fourth digit.  No tenderness to palpation along the tendon sheath.  Action/extension of the DIP of the left fourth finger intact when held in isolation.  Flexion/extension of left thumb, index finger, long finger, pinky intact without any difficulty.  Flexion/extension of the wrist intact without any difficulty.  No tenderness palpation of the left elbow, left shoulder.  Full range of motion of right upper extremity without any difficulty.  Bilateral hands look symmetric in appearance.  The hand is without any overlying warmth, erythema, edema.  Neurological: She is alert.  Sensation intact along major nerve distributions of BUE  Skin: Skin is warm and dry. Capillary refill takes less than  2 seconds.  Good distal cap refill. LUE is not dusky in appearance or cool to touch.  Psychiatric: She has a normal mood and affect. Her speech is normal and behavior is normal.  Nursing note and vitals reviewed.    ED Treatments / Results  Labs (all labs ordered are listed, but only abnormal results are displayed) Labs Reviewed - No data to display  EKG None  Radiology Dg Hand Complete Left  Result Date: 08/13/2018 CLINICAL DATA:  Hand swelling and pain third through fifth digits EXAM: LEFT HAND - COMPLETE 3+ VIEW COMPARISON:  None. FINDINGS: There is no evidence of fracture or dislocation. Probable small spur base of the first metacarpal. Soft tissues are unremarkable. IMPRESSION: No acute osseous  abnormality. Electronically Signed   By: Donavan Foil M.D.   On: 08/13/2018 15:41    Procedures .Nerve Block Date/Time: 08/13/2018 5:30 PM Performed by: Volanda Napoleon, PA-C Authorized by: Volanda Napoleon, PA-C   Consent:    Consent obtained:  Verbal   Consent given by:  Patient   Risks discussed:  Infection, nerve damage, swelling, unsuccessful block and pain Indications:    Indications:  Pain relief Location:    Body area:  Upper extremity   Upper extremity nerve blocked: left 4th finger.   Laterality:  Left Pre-procedure details:    Skin preparation:  Alcohol Procedure details (see MAR for exact dosages):    Needle gauge: 25    Anesthetic injected:  Bupivacaine 0.5% w/o epi   Steroid injected:  None   Additive injected:  None   Injection procedure:  Anatomic landmarks identified, incremental injection, negative aspiration for blood and introduced needle Post-procedure details:    Dressing:  Sterile dressing   Outcome:  Anesthesia achieved   Patient tolerance of procedure:  Tolerated well, no immediate complications   (including critical care time)  Medications Ordered in ED Medications  HYDROcodone-acetaminophen (NORCO/VICODIN) 5-325 MG per tablet 1 tablet (1  tablet Oral Given 08/13/18 1633)  bupivacaine (MARCAINE) 0.5 % (with pres) injection 10 mL (10 mLs Infiltration Given 08/13/18 1656)     Initial Impression / Assessment and Plan / ED Course  I have reviewed the triage vital signs and the nursing notes.  Pertinent labs & imaging results that were available during my care of the patient were reviewed by me and considered in my medical decision making (see chart for details).     42 year old female who presents for evaluation of left hand pain x2 days.  Reports that pain is more focalized in her left fourth digit.  She states that about 2 days ago, she noticed some swelling.  No overlying warmth, erythema.  Tenderness to the MCP.  Additionally, fingers slightly flexed and she has pain with extension.  There is no tenderness along the tendon sheath.  There is no overlying warmth, erythema.  Left fourth digit itself is not significantly swollen.  She has difficulty extending the left fourth digit.  She has full flexion/extension of all other digits without any difficulty.  Left palm/hand is without any overlying warmth, erythema, edema.  Flexion extension of wrist intact any difficulty.  Exam is concerning for trigger finger.  History/physical exam is not concerning for acute arterial embolism, flexor tenosynovitis.  History/physical exam is not concerning for DVT, acute arterial embolism, septic arthritis.  X-ray ordered at triage.  Analgesics provided.  X-ray reviewed.  Negative for any acute bony abnormality.  Discussed results with patient.  Reevaluation after pain medication.  Patient still having extensive pain.  Discussed doing a digital block to help with resolution of pain.  Digital block performed as document above.  After digital block patient patient has slightly better extension.  Still unable to extend it all the way.  She reports improvement in pain after digital block.  At this time, there are no inflammatory signs that would be concerning  for flexor tenosynovitis or any other acute infectious cause.  Patient requesting some splint for support and stabilization.  At this time she has better attention but can still extend all the way.  Will give a small splint for supportive care.  Instructed patient that she will need to follow-up with hand surgeon for further evaluation and management of her symptoms. Patient  had ample opportunity for questions and discussion. All patient's questions were answered with full understanding. Strict return precautions discussed. Patient expresses understanding and agreement to plan.   Final Clinical Impressions(s) / ED Diagnoses   Final diagnoses:  Trigger ring finger of left hand    ED Discharge Orders         Ordered    naproxen (NAPROSYN) 500 MG tablet  2 times daily     08/13/18 1728           Desma Mcgregor 08/13/18 Brien Mates, MD 08/13/18 1907    Volanda Napoleon, PA-C 08/14/18 [id number]    Blanchie Dessert, MD 08/14/18 1513

## 2018-09-21 DIAGNOSIS — H04123 Dry eye syndrome of bilateral lacrimal glands: Secondary | ICD-10-CM | POA: Diagnosis not present

## 2018-09-21 DIAGNOSIS — M3501 Sicca syndrome with keratoconjunctivitis: Secondary | ICD-10-CM | POA: Diagnosis not present

## 2018-09-21 DIAGNOSIS — H5789 Other specified disorders of eye and adnexa: Secondary | ICD-10-CM | POA: Diagnosis not present

## 2019-01-26 DIAGNOSIS — J209 Acute bronchitis, unspecified: Secondary | ICD-10-CM | POA: Diagnosis not present

## 2019-01-26 DIAGNOSIS — J069 Acute upper respiratory infection, unspecified: Secondary | ICD-10-CM | POA: Diagnosis not present

## 2019-02-23 ENCOUNTER — Ambulatory Visit: Payer: BLUE CROSS/BLUE SHIELD | Admitting: Internal Medicine

## 2019-02-23 ENCOUNTER — Other Ambulatory Visit: Payer: Self-pay

## 2019-03-02 DIAGNOSIS — M3501 Sicca syndrome with keratoconjunctivitis: Secondary | ICD-10-CM | POA: Diagnosis not present

## 2019-03-02 DIAGNOSIS — M358 Other specified systemic involvement of connective tissue: Secondary | ICD-10-CM | POA: Diagnosis not present

## 2019-03-02 DIAGNOSIS — H04123 Dry eye syndrome of bilateral lacrimal glands: Secondary | ICD-10-CM | POA: Diagnosis not present

## 2019-03-02 DIAGNOSIS — H35423 Microcystoid degeneration of retina, bilateral: Secondary | ICD-10-CM | POA: Diagnosis not present

## 2019-03-02 DIAGNOSIS — H5789 Other specified disorders of eye and adnexa: Secondary | ICD-10-CM | POA: Diagnosis not present

## 2019-03-16 DIAGNOSIS — M5417 Radiculopathy, lumbosacral region: Secondary | ICD-10-CM | POA: Diagnosis not present

## 2019-03-16 DIAGNOSIS — M9901 Segmental and somatic dysfunction of cervical region: Secondary | ICD-10-CM | POA: Diagnosis not present

## 2019-03-16 DIAGNOSIS — M9903 Segmental and somatic dysfunction of lumbar region: Secondary | ICD-10-CM | POA: Diagnosis not present

## 2019-03-16 DIAGNOSIS — M5413 Radiculopathy, cervicothoracic region: Secondary | ICD-10-CM | POA: Diagnosis not present

## 2019-03-18 DIAGNOSIS — M5413 Radiculopathy, cervicothoracic region: Secondary | ICD-10-CM | POA: Diagnosis not present

## 2019-03-18 DIAGNOSIS — M9903 Segmental and somatic dysfunction of lumbar region: Secondary | ICD-10-CM | POA: Diagnosis not present

## 2019-03-18 DIAGNOSIS — M5417 Radiculopathy, lumbosacral region: Secondary | ICD-10-CM | POA: Diagnosis not present

## 2019-03-18 DIAGNOSIS — M9901 Segmental and somatic dysfunction of cervical region: Secondary | ICD-10-CM | POA: Diagnosis not present

## 2019-03-21 DIAGNOSIS — M5413 Radiculopathy, cervicothoracic region: Secondary | ICD-10-CM | POA: Diagnosis not present

## 2019-03-21 DIAGNOSIS — M9903 Segmental and somatic dysfunction of lumbar region: Secondary | ICD-10-CM | POA: Diagnosis not present

## 2019-03-21 DIAGNOSIS — M9901 Segmental and somatic dysfunction of cervical region: Secondary | ICD-10-CM | POA: Diagnosis not present

## 2019-03-21 DIAGNOSIS — M5417 Radiculopathy, lumbosacral region: Secondary | ICD-10-CM | POA: Diagnosis not present

## 2019-03-24 DIAGNOSIS — M9903 Segmental and somatic dysfunction of lumbar region: Secondary | ICD-10-CM | POA: Diagnosis not present

## 2019-03-24 DIAGNOSIS — M5417 Radiculopathy, lumbosacral region: Secondary | ICD-10-CM | POA: Diagnosis not present

## 2019-03-24 DIAGNOSIS — M9901 Segmental and somatic dysfunction of cervical region: Secondary | ICD-10-CM | POA: Diagnosis not present

## 2019-03-24 DIAGNOSIS — M5413 Radiculopathy, cervicothoracic region: Secondary | ICD-10-CM | POA: Diagnosis not present

## 2019-03-31 DIAGNOSIS — M9901 Segmental and somatic dysfunction of cervical region: Secondary | ICD-10-CM | POA: Diagnosis not present

## 2019-03-31 DIAGNOSIS — M5413 Radiculopathy, cervicothoracic region: Secondary | ICD-10-CM | POA: Diagnosis not present

## 2019-03-31 DIAGNOSIS — M9903 Segmental and somatic dysfunction of lumbar region: Secondary | ICD-10-CM | POA: Diagnosis not present

## 2019-03-31 DIAGNOSIS — M5417 Radiculopathy, lumbosacral region: Secondary | ICD-10-CM | POA: Diagnosis not present

## 2019-04-13 DIAGNOSIS — M5417 Radiculopathy, lumbosacral region: Secondary | ICD-10-CM | POA: Diagnosis not present

## 2019-04-13 DIAGNOSIS — M9903 Segmental and somatic dysfunction of lumbar region: Secondary | ICD-10-CM | POA: Diagnosis not present

## 2019-04-13 DIAGNOSIS — M5413 Radiculopathy, cervicothoracic region: Secondary | ICD-10-CM | POA: Diagnosis not present

## 2019-04-13 DIAGNOSIS — M9901 Segmental and somatic dysfunction of cervical region: Secondary | ICD-10-CM | POA: Diagnosis not present

## 2019-08-10 DIAGNOSIS — M545 Low back pain: Secondary | ICD-10-CM | POA: Diagnosis not present

## 2019-08-12 ENCOUNTER — Other Ambulatory Visit: Payer: Self-pay

## 2019-08-12 ENCOUNTER — Encounter (HOSPITAL_BASED_OUTPATIENT_CLINIC_OR_DEPARTMENT_OTHER): Payer: Self-pay

## 2019-08-12 ENCOUNTER — Emergency Department (HOSPITAL_BASED_OUTPATIENT_CLINIC_OR_DEPARTMENT_OTHER)
Admission: EM | Admit: 2019-08-12 | Discharge: 2019-08-12 | Disposition: A | Discharge status: Home / Self Care | Payer: BC Managed Care – PPO | Attending: Emergency Medicine | Admitting: Emergency Medicine

## 2019-08-12 ENCOUNTER — Emergency Department (HOSPITAL_BASED_OUTPATIENT_CLINIC_OR_DEPARTMENT_OTHER): Payer: BC Managed Care – PPO

## 2019-08-12 DIAGNOSIS — Z91040 Latex allergy status: Secondary | ICD-10-CM | POA: Diagnosis not present

## 2019-08-12 DIAGNOSIS — M545 Low back pain, unspecified: Secondary | ICD-10-CM

## 2019-08-12 DIAGNOSIS — J45909 Unspecified asthma, uncomplicated: Secondary | ICD-10-CM | POA: Insufficient documentation

## 2019-08-12 DIAGNOSIS — R109 Unspecified abdominal pain: Secondary | ICD-10-CM | POA: Insufficient documentation

## 2019-08-12 LAB — PREGNANCY, URINE: Preg Test, Ur: NEGATIVE

## 2019-08-12 LAB — CBC WITH DIFFERENTIAL/PLATELET
Abs Immature Granulocytes: 0.02 10*3/uL (ref 0.00–0.07)
Basophils Absolute: 0 10*3/uL (ref 0.0–0.1)
Basophils Relative: 0 %
Eosinophils Absolute: 0.1 10*3/uL (ref 0.0–0.5)
Eosinophils Relative: 1 %
HCT: 39.5 % (ref 36.0–46.0)
Hemoglobin: 11.9 g/dL — ABNORMAL LOW (ref 12.0–15.0)
Immature Granulocytes: 0 %
Lymphocytes Relative: 21 %
Lymphs Abs: 1.7 10*3/uL (ref 0.7–4.0)
MCH: 25 pg — ABNORMAL LOW (ref 26.0–34.0)
MCHC: 30.1 g/dL (ref 30.0–36.0)
MCV: 83 fL (ref 80.0–100.0)
Monocytes Absolute: 1 10*3/uL (ref 0.1–1.0)
Monocytes Relative: 12 %
Neutro Abs: 5.3 10*3/uL (ref 1.7–7.7)
Neutrophils Relative %: 66 %
Platelets: 313 10*3/uL (ref 150–400)
RBC: 4.76 MIL/uL (ref 3.87–5.11)
RDW: 15.5 % (ref 11.5–15.5)
WBC: 8.1 10*3/uL (ref 4.0–10.5)
nRBC: 0 % (ref 0.0–0.2)

## 2019-08-12 LAB — COMPREHENSIVE METABOLIC PANEL
ALT: 14 U/L (ref 0–44)
AST: 17 U/L (ref 15–41)
Albumin: 4.2 g/dL (ref 3.5–5.0)
Alkaline Phosphatase: 68 U/L (ref 38–126)
Anion gap: 8 (ref 5–15)
BUN: 12 mg/dL (ref 6–20)
CO2: 25 mmol/L (ref 22–32)
Calcium: 9.2 mg/dL (ref 8.9–10.3)
Chloride: 103 mmol/L (ref 98–111)
Creatinine, Ser: 0.73 mg/dL (ref 0.44–1.00)
GFR calc Af Amer: >60 mL/min (ref >60)
GFR calc non Af Amer: >60 mL/min (ref >60)
Glucose, Bld: 97 mg/dL (ref 70–99)
Potassium: 4.2 mmol/L (ref 3.5–5.1)
Sodium: 136 mmol/L (ref 135–145)
Total Bilirubin: 0.2 mg/dL — ABNORMAL LOW (ref 0.3–1.2)
Total Protein: 8.2 g/dL — ABNORMAL HIGH (ref 6.5–8.1)

## 2019-08-12 LAB — URINALYSIS, ROUTINE W REFLEX MICROSCOPIC
Bilirubin Urine: NEGATIVE
Glucose, UA: NEGATIVE mg/dL
Ketones, ur: NEGATIVE mg/dL
Leukocytes,Ua: NEGATIVE
Nitrite: NEGATIVE
Protein, ur: NEGATIVE mg/dL
Specific Gravity, Urine: 1.015 (ref 1.005–1.030)
pH: 6 (ref 5.0–8.0)

## 2019-08-12 LAB — URINALYSIS, MICROSCOPIC (REFLEX): RBC / HPF: >50 RBC/hpf (ref 0–5)

## 2019-08-12 LAB — LIPASE, BLOOD: Lipase: 25 U/L (ref 11–51)

## 2019-08-12 MED ORDER — MORPHINE SULFATE (PF) 4 MG/ML IV SOLN
4.0000 mg | Freq: Once | INTRAVENOUS | Status: AC
  Administered 2019-08-12: 4 mg via INTRAVENOUS
  Filled 2019-08-12: qty 1

## 2019-08-12 MED ORDER — KETOROLAC TROMETHAMINE 30 MG/ML IJ SOLN
30.0000 mg | Freq: Once | INTRAMUSCULAR | Status: AC
  Administered 2019-08-12: 30 mg via INTRAVENOUS
  Filled 2019-08-12: qty 1

## 2019-08-12 MED ORDER — METHOCARBAMOL 500 MG PO TABS
500.0000 mg | ORAL_TABLET | Freq: Once | ORAL | Status: AC
  Administered 2019-08-12: 500 mg via ORAL
  Filled 2019-08-12: qty 1

## 2019-08-12 MED ORDER — ONDANSETRON HCL 4 MG/2ML IJ SOLN
4.0000 mg | Freq: Once | INTRAMUSCULAR | Status: AC
  Administered 2019-08-12: 4 mg via INTRAVENOUS
  Filled 2019-08-12: qty 2

## 2019-08-12 MED ORDER — SODIUM CHLORIDE 0.9 % IV BOLUS
1000.0000 mL | Freq: Once | INTRAVENOUS | Status: AC
  Administered 2019-08-12: 1000 mL via INTRAVENOUS

## 2019-08-12 MED ORDER — NAPROXEN 500 MG PO TABS: 500.0000 mg | ORAL_TABLET | Freq: Two times a day (BID) | ORAL | 0 refills | Status: DC

## 2019-08-12 MED ORDER — METHOCARBAMOL 500 MG PO TABS: 500.0000 mg | ORAL_TABLET | Freq: Two times a day (BID) | ORAL | 0 refills | Status: DC

## 2019-08-12 NOTE — ED Triage Notes (Signed)
Pt reports L back pain that radiates into LQ. Pt tearful during triage. Pt saw PCP earlier this week and given muscle relaxer. Pt reports pain is getting worse.

## 2019-08-12 NOTE — Discharge Instructions (Signed)
You were seen here today for Back Pain: Low back pain is discomfort in the lower back that may be due to injuries to muscles and ligaments around the spine. Occasionally, it may be caused by a problem to a part of the spine called a disc. Your back pain should be treated with medicines listed below as well as back exercises and this back pain should get better over the next 2 weeks. Most patients get completely well in 4 weeks. It is important to know however, if you develop severe or worsening pain, low back pain with fever, numbness, weakness or inability to walk or urinate, you should return to the ER immediately.  Please follow up with your doctor this week for a recheck if still having symptoms.  HOME INSTRUCTIONS Self - care:  The application of heat can help soothe the pain.  Maintaining your daily activities, including walking (this is encouraged), as it will help you get better faster than just staying in bed. Do not life, push, pull anything more than 10 pounds for the next week. I am attaching back exercises that you can do at home to help facilitate your recovery.   Back Exercises - I have attached a handout on back exercises that can be done at home to help facilitate your recovery.   Medications are also useful to help with pain control.   Acetaminophen.  This medication is generally safe, and found over the counter. Take as directed for your age. You should not take more than 8 of the extra strength (500mg ) pills a day (max dose is 4000mg  total OVER one day)  Lidocaine Patches: You can purchase over-the-counter salon lidocaine patches (blue and silver box), these can be worn for 12 hours and provide local relief.  pas   Non steroidal anti inflammatory: This includes medications including Ibuprofen, naproxen and Mobic; These medications help both pain and swelling and are very useful in treating back pain.  They should be taken with food, as they can cause stomach upset, and more  seriously, stomach bleeding. Do not combine the medications.   Muscle relaxants:  These medications can help with muscle tightness that is a cause of lower back pain.  Most of these medications can cause drowsiness, and it is not safe to drive or use dangerous machinery while taking them. They are primarily helpful when taken at night before sleep.  You will need to follow up with your primary healthcare provider or the Orthopedist in 1-2 weeks for reassessment and persistent symptoms.  Be aware that if you develop new symptoms, such as a fever, leg weakness, difficulty with or loss of control of your urine or bowels, abdominal pain, or more severe pain, you will need to seek medical attention and/or return to the Emergency department. Additional Information:  Your vital signs today were: BP (!) 150/111 (BP Location: Right Arm)    Pulse 88    Temp 98.8 F (37.1 C) (Oral)    Resp 18    Ht 5\' 2"  (1.575 m)    Wt 77.1 kg    LMP 08/11/2019    SpO2 99%    BMI 31.09 kg/m  If your blood pressure (BP) was elevated above 135/85 this visit, please have this repeated by your doctor within one month. ---------------

## 2019-08-12 NOTE — ED Notes (Signed)
Patient able to ambulate around nurses station. Patient gait stable. No complaints of pain.

## 2019-08-12 NOTE — ED Notes (Signed)
Patient transported to CT 

## 2019-08-12 NOTE — ED Provider Notes (Signed)
[facility] EMERGENCY DEPARTMENT Provider Note   CSN: [id number] Arrival date & time: 08/12/19  1553     History   Chief Complaint Chief Complaint  Patient presents with  . Back Pain    HPI Alexis Bernard is a 43 y.o. female.     Alexis Bernard is a 43 y.o. female with Sjogren's disease, spinal stenosis, asthma, arthritis, migraines, heart murmur, who presents to the emergency department for evaluation of left flank and back pain.  Patient reports symptoms started about 3 days ago.  She reports severe pain on the left side of her back.  Reports it intermittently become sharp and she has difficulty finding a comfortable position.  She saw her PCP regarding this pain 2 days ago, they felt that it was most likely musculoskeletal, she was prescribed NSAIDs and muscle relaxer, she had a few doses of muscle relaxer from previous episode of pain left at home, and did not pick up NSAID from pharmacy, has not been taking any other medications.  Reports minimal improvement, Flexeril just seems to make her sleepy.  She reports that today pain was significantly worse, she called back to PCP and was directed to the emergency department.  Urinalysis was collected at PCPs office, which patient initially understood to be normal, but then nurse told her that there was a small amount of blood present, questioning whether patient could have kidney stone.  No previous history of kidney stones.  She does localize pain over her left flank.  She has not had any dysuria or urinary frequency.  No fevers.  No anterior abdominal pain.  She does report the pain made her nauseated today, but no vomiting.  No diarrhea or constipation.  No fevers.  No pain radiating into her legs, no numbness, weakness or tingling in her lower extremities.  No loss of bowel or bladder control or saddle anesthesia.  No personal history of IV drug use or cancer.  No other aggravating or alleviating factors.      Past Medical History:  Diagnosis Date  . Arthritis   . Asthma   . Complication of anesthesia   . Dysrhythmia    as a child  . Headache    migraines  . Hypoglycemia   . Murmur    pt denies currently, as a child  . PONV (postoperative nausea and vomiting)   . S[place]'s disease ([place])   . Spinal stenosis     Patient Active Problem List   Diagnosis Date Noted  . Cervical vertebral fusion 10/29/2017    Past Surgical History:  Procedure Laterality Date  . ANTERIOR CERVICAL DECOMP/DISCECTOMY FUSION N/A 10/29/2017   Procedure: Anterior Cervical Decompression Fusion - Cervical four-Cervical five - Cervical five-Cervical six;  Surgeon: Eustace Moore, MD;  Location: [facility];  Service: Neurosurgery;  Laterality: N/A;  . FRACTURE SURGERY     anklex4  . laproscopic surgery to evaluate endometriosis       OB History   No obstetric history on file.      Home Medications    Prior to Admission medications   Medication Sig Start Date End Date Taking? Authorizing Provider  albuterol (PROVENTIL HFA;VENTOLIN HFA) 108 (90 Base) MCG/ACT inhaler Inhale 2 puffs into the lungs every 6 (six) hours as needed for wheezing or shortness of breath.    [provider]  aspirin-acetaminophen-caffeine (EXCEDRIN MIGRAINE) [phone] MG tablet Take 2 tablets by mouth 2 (two) times daily as needed for migraine.  [provider]  Aspirin-Acetaminophen-Caffeine (PAMPRIN MAX PO) Take 3 tablets by mouth daily as needed (CRAMPS).    [provider]  cyclobenzaprine (FLEXERIL) 10 MG tablet Take 1 tablet (10 mg total) by mouth daily as needed for muscle spasms. 10/30/17   Meyran, Ocie Cornfield, NP  ergocalciferol (VITAMIN D2) 50000 units capsule Take 50,000 Units by mouth once a week. Mondays    [provider]  HYDROcodone-acetaminophen (NORCO) 7.5-325 MG tablet Take 2 tablets by mouth every 4 (four) hours as needed for severe pain ((score 7 to 10)). 10/30/17   Meyran,  Ocie Cornfield, NP  ibuprofen (ADVIL,MOTRIN) 200 MG tablet Take 600 mg by mouth every 8 (eight) hours as needed for mild pain.    [provider]  meloxicam (MOBIC) 15 MG tablet Take 15 mg by mouth daily as needed for pain.  09/21/17   [provider]  methocarbamol (ROBAXIN) 500 MG tablet Take 1 tablet (500 mg total) by mouth 2 (two) times daily. 08/12/19   Jacqlyn Larsen, PA-C  naproxen (NAPROSYN) 500 MG tablet Take 1 tablet (500 mg total) by mouth 2 (two) times daily. 08/12/19   Jacqlyn Larsen, PA-C  naproxen sodium (ALEVE) 220 MG tablet Take 440 mg by mouth 2 (two) times daily as needed (PAIN).    [provider]  Prenatal MV-Min-FA-Omega-3 (PRENATAL GUMMIES/DHA & FA PO) Take 2 each by mouth daily.    [provider]  SUMAtriptan (IMITREX) 50 MG tablet TAKE 1 TABLET BY MOUTH  EVRY 2 HOURS AS NEEDED MIGRAINE 08/22/17   [provider]    Family History No family history on file.  Social History Social History   Tobacco Use  . Smoking status: Never Smoker  . Smokeless tobacco: Never Used  Substance Use Topics  . Alcohol use: Yes    Comment: occ   . Drug use: No     Allergies   Latex   Review of Systems Review of Systems  Constitutional: Negative for chills and fever.  HENT: Negative.   Respiratory: Negative for shortness of breath.   Cardiovascular: Negative for chest pain.  Gastrointestinal: Negative for abdominal pain, constipation, diarrhea, nausea and vomiting.  Genitourinary: Positive for flank pain and vaginal bleeding (On regular menstrual cycle). Negative for dysuria and frequency.  Musculoskeletal: Positive for back pain. Negative for arthralgias, gait problem, joint swelling, myalgias and neck pain.  Skin: Negative for color change, rash and wound.  Neurological: Negative for weakness and numbness.     Physical Exam Updated Vital Signs BP (!) 150/111 (BP Location: Right Arm)   Pulse 88   Resp 18   Ht 5\' 2"  (1.575  m)   Wt 77.1 kg   LMP 08/11/2019   SpO2 99%   BMI 31.09 kg/m   Physical Exam Vitals signs and nursing note reviewed.  Constitutional:      General: She is not in acute distress.    Appearance: Normal appearance. She is well-developed and normal weight. She is not ill-appearing or diaphoretic.     Comments: Pt appears uncomfortable, but is in no acute distress  HENT:     Head: Atraumatic.  Eyes:     General:        Right eye: No discharge.        Left eye: No discharge.  Neck:     Musculoskeletal: Neck supple.  Cardiovascular:     Rate and Rhythm: Normal rate and regular rhythm.     Pulses:  Radial pulses are 2+ on the right side and 2+ on the left side.       Dorsalis pedis pulses are 2+ on the right side and 2+ on the left side.       Posterior tibial pulses are 2+ on the right side and 2+ on the left side.     Heart sounds: Normal heart sounds. No murmur. No friction rub. No gallop.   Pulmonary:     Effort: Pulmonary effort is normal. No respiratory distress.     Breath sounds: Normal breath sounds.     Comments: Respirations equal and unlabored, patient able to speak in full sentences, lungs clear to auscultation bilaterally Abdominal:     General: Bowel sounds are normal. There is no distension.     Palpations: Abdomen is soft. There is no mass.     Tenderness: There is no abdominal tenderness. There is left CVA tenderness. There is no guarding.     Comments: Abdomen soft, nondistended, nontender to palpation in all quadrants without guarding or peritoneal signs, there is some left CVA tenderness  Musculoskeletal:     Comments: Tenderness to palpation over left low back musculature with palpable spasm, no overlying skin changes.  No midline lumbar spine tenderness.  Skin:    General: Skin is warm and dry.     Capillary Refill: Capillary refill takes less than 2 seconds.  Neurological:     Mental Status: She is alert and oriented to person, place, and time.      Comments: Alert, clear speech, following commands. Moving all extremities without difficulty. Bilateral lower extremities with 5/5 strength in proximal and distal muscle groups and with dorsi and plantar flexion. Sensation intact in bilateral lower extremities. 2+ patellar DTRs bilaterally. Ambulatory with steady gait  Psychiatric:        Mood and Affect: Mood normal.        Behavior: Behavior normal.      ED Treatments / Results  Labs (all labs ordered are listed, but only abnormal results are displayed) Labs Reviewed  URINALYSIS, ROUTINE W REFLEX MICROSCOPIC - Abnormal; Notable for the following components:      Result Value   Color, Urine PINK (*)    APPearance CLOUDY (*)    Hgb urine dipstick LARGE (*)    All other components within normal limits  COMPREHENSIVE METABOLIC PANEL - Abnormal; Notable for the following components:   Total Protein 8.2 (*)    Total Bilirubin 0.2 (*)    All other components within normal limits  CBC WITH DIFFERENTIAL/PLATELET - Abnormal; Notable for the following components:   Hemoglobin 11.9 (*)    MCH 25.0 (*)    All other components within normal limits  URINALYSIS, MICROSCOPIC (REFLEX) - Abnormal; Notable for the following components:   Bacteria, UA RARE (*)    All other components within normal limits  PREGNANCY, URINE  LIPASE, BLOOD    EKG None  Radiology Ct Renal Stone Study  Result Date: 08/12/2019 CLINICAL DATA:  Left flank pain radiating to the left upper quadrant EXAM: CT ABDOMEN AND PELVIS WITHOUT CONTRAST TECHNIQUE: Multidetector CT imaging of the abdomen and pelvis was performed following the standard protocol without IV contrast. COMPARISON:  Abdominal ultrasound 02/06/2018 FINDINGS: Lower chest: In the lateral segment left hepatic lobe near the falciform ligament, a 0.9 by 0.6 cm hypodense lesion is present on image 16/2. In the dome of the right hepatic lobe, a 0.6 cm hypodense lesion is present on image 12/2. The  gallbladder  appears unremarkable. Hepatobiliary: Unremarkable Pancreas: Unremarkable Spleen: Unremarkable Adrenals/Urinary Tract: Unremarkable Stomach/Bowel: Descending and sigmoid colon diverticulosis, no findings of active diverticulitis. Appendix normal. Mild prominence of stool in the proximal colon Vascular/Lymphatic: Unremarkable Reproductive: Mildly retroverted uterus incidentally noted. Adnexa unremarkable. Other: Trace free fluid in the cul-de-sac, probably physiologic. Musculoskeletal: Unremarkable IMPRESSION: 1. A cause for the patient's left flank pain is not identified. 2. Descending and sigmoid colon diverticulosis without active diverticulitis. 3. Mild prominence of stool in the colon, particularly the proximal colon, query constipation. 4. Two small hypodense lesions are present in the liver. Of these probably represents the cyst seen on prior ultrasound of 02/06/2018. Both of the lesions are otherwise technically nonspecific due to small size and lack of IV contrast. Electronically Signed   By: Van Clines M.D.   On: 08/12/2019 18:25    Procedures Procedures (including critical care time)  Medications Ordered in ED Medications  morphine 4 MG/ML injection 4 mg (4 mg Intravenous Given 08/12/19 1716)  ondansetron (ZOFRAN) injection 4 mg (4 mg Intravenous Given 08/12/19 1714)  sodium chloride 0.9 % bolus 1,000 mL ( Intravenous Stopped 08/12/19 1917)  ketorolac (TORADOL) 30 MG/ML injection 30 mg (30 mg Intravenous Given 08/12/19 1851)  methocarbamol (ROBAXIN) tablet 500 mg (500 mg Oral Given 08/12/19 1851)     Initial Impression / Assessment and Plan / ED Course  I have reviewed the triage vital signs and the nursing notes.  Pertinent labs & imaging results that were available during my care of the patient were reviewed by me and considered in my medical decision making (see chart for details).  43 year old female presents to the ED for left flank and back pain over the past 3 days.  Initially  pain thought to be musculoskeletal, but after worsening despite muscle relaxers, sent to ED for further evaluation.  On arrival patient afebrile with normal vitals, appears uncomfortable but in no acute distress.  No anterior abdominal tenderness, there is some mild left CVA tenderness as well as tenderness over the left low back musculature with palpable spasm, no midline lumbar spine tenderness, and no overlying skin changes.  Patient has not had any chest pain or shortness of breath.  Question whether patient could have kidney stone or other acute intra-abdominal cause for pain.  Will get abdominal labs, urinalysis, as well as CT renal study.  IV fluids, morphine and Zofran given for symptom control.  Labs overall reassuring, no leukocytosis, stable hemoglobin, no significant electrolyte derangements, normal renal and liver function and normal lipase.  Urinalysis with RBCs present, patient is currently on her menstrual cycle which started yesterday, but patient with no leukocytes, WBCs or bacteria to suggest infection.  Pregnancy negative.  CT scan reassuring, no evidence of kidney stone or other obstructive uropathy, diverticulosis noted, no other acute abnormalities.  2 small lesions noted within the liver, patient has known history of these, will have her follow-up with PCP regarding these.  Given reassuring CT and lab work I do suspect that this is musculoskeletal back pain.  Patient treated with NSAID and Robaxin here in the ED with improvement in pain, ambulatory in the department.  Will discharge with NSAIDs, Robaxin rather than Flexeril as this seemed to help more.  I have also encouraged patient to use over-the-counter lidocaine patches.  Orthopedic follow-up encouraged if not improving.  Return precautions discussed.  Patient expresses understanding and agreement, discharged home in good condition.  Final Clinical Impressions(s) / ED Diagnoses   Final diagnoses:  Acute left-sided low back  pain without sciatica    ED Discharge Orders         Ordered    naproxen (NAPROSYN) 500 MG tablet  2 times daily     08/12/19 2000    methocarbamol (ROBAXIN) 500 MG tablet  2 times daily     08/12/19 2000           Jacqlyn Larsen, [place] 08/13/19 [id number]    Tegeler, Gwenyth Allegra, MD 08/13/19 [phone]

## 2019-08-15 DIAGNOSIS — M9903 Segmental and somatic dysfunction of lumbar region: Secondary | ICD-10-CM | POA: Diagnosis not present

## 2019-08-15 DIAGNOSIS — M9901 Segmental and somatic dysfunction of cervical region: Secondary | ICD-10-CM | POA: Diagnosis not present

## 2019-08-15 DIAGNOSIS — M5417 Radiculopathy, lumbosacral region: Secondary | ICD-10-CM | POA: Diagnosis not present

## 2019-08-15 DIAGNOSIS — M5413 Radiculopathy, cervicothoracic region: Secondary | ICD-10-CM | POA: Diagnosis not present

## 2019-09-12 DIAGNOSIS — M9903 Segmental and somatic dysfunction of lumbar region: Secondary | ICD-10-CM | POA: Diagnosis not present

## 2019-09-12 DIAGNOSIS — M5417 Radiculopathy, lumbosacral region: Secondary | ICD-10-CM | POA: Diagnosis not present

## 2019-09-12 DIAGNOSIS — M5413 Radiculopathy, cervicothoracic region: Secondary | ICD-10-CM | POA: Diagnosis not present

## 2019-09-12 DIAGNOSIS — M9901 Segmental and somatic dysfunction of cervical region: Secondary | ICD-10-CM | POA: Diagnosis not present

## 2019-10-19 DIAGNOSIS — D649 Anemia, unspecified: Secondary | ICD-10-CM | POA: Diagnosis not present

## 2019-10-19 DIAGNOSIS — R400 Somnolence: Secondary | ICD-10-CM | POA: Diagnosis not present

## 2019-10-19 DIAGNOSIS — N644 Mastodynia: Secondary | ICD-10-CM | POA: Diagnosis not present

## 2019-10-19 DIAGNOSIS — R5383 Other fatigue: Secondary | ICD-10-CM | POA: Diagnosis not present

## 2019-10-20 DIAGNOSIS — F39 Unspecified mood [affective] disorder: Secondary | ICD-10-CM | POA: Diagnosis not present

## 2019-10-20 DIAGNOSIS — F5104 Psychophysiologic insomnia: Secondary | ICD-10-CM | POA: Diagnosis not present

## 2019-10-20 DIAGNOSIS — G4719 Other hypersomnia: Secondary | ICD-10-CM | POA: Diagnosis not present

## 2019-10-27 DIAGNOSIS — R1310 Dysphagia, unspecified: Secondary | ICD-10-CM | POA: Diagnosis not present

## 2019-10-27 DIAGNOSIS — M5412 Radiculopathy, cervical region: Secondary | ICD-10-CM | POA: Diagnosis not present

## 2019-10-27 DIAGNOSIS — Z6830 Body mass index (BMI) 30.0-30.9, adult: Secondary | ICD-10-CM | POA: Diagnosis not present

## 2020-02-05 IMAGING — US US ABDOMEN COMPLETE
1 series · 14 of 25 positions shown · non-contrast
Comparison: None.

CLINICAL DATA: Generalized abdominal pain.

EXAM:
ABDOMEN ULTRASOUND COMPLETE

[Series 1: us abdomen complete · 0.17mm/px · 14 of 68 slices shown]
[im 1/68]
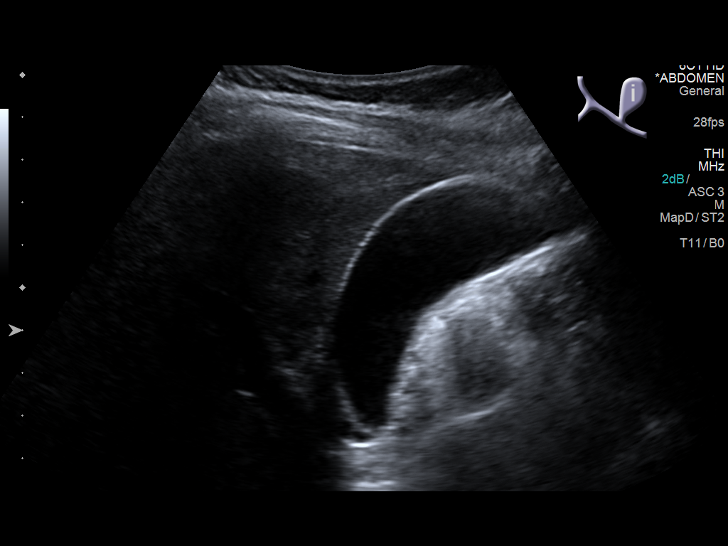
[im 6/68]
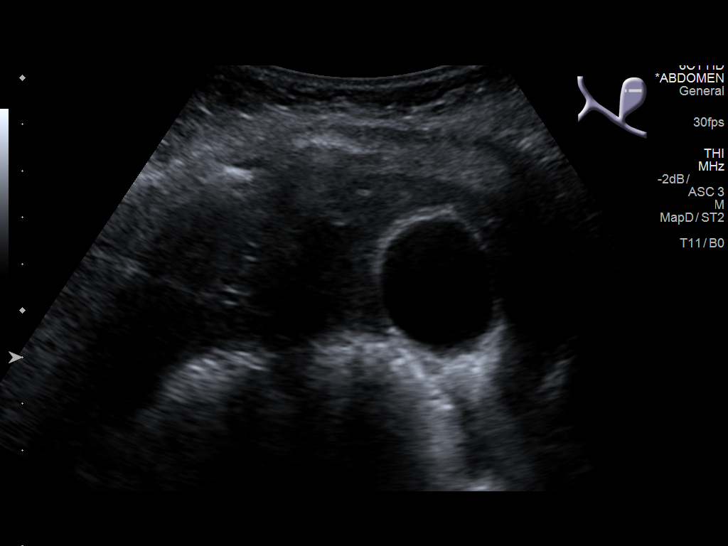
[im 12/68]
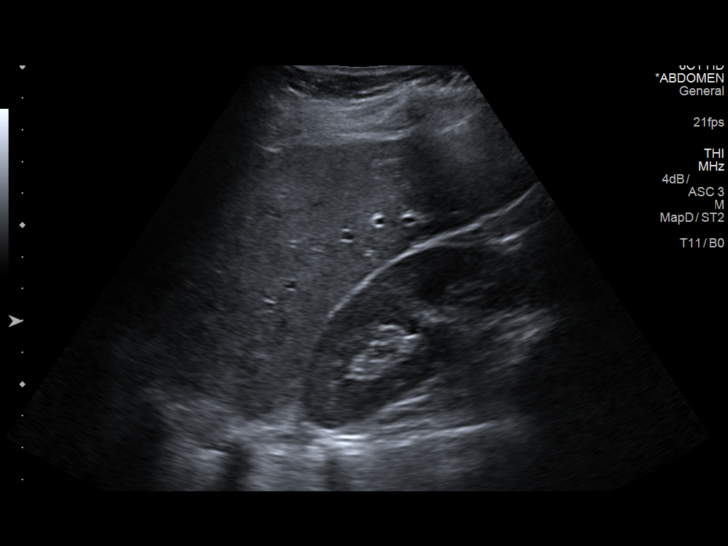
[im 17/68]
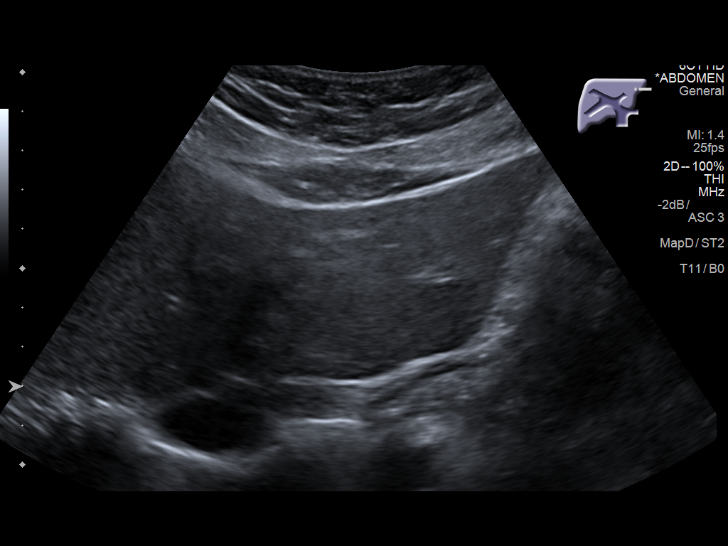
[im 23/68]
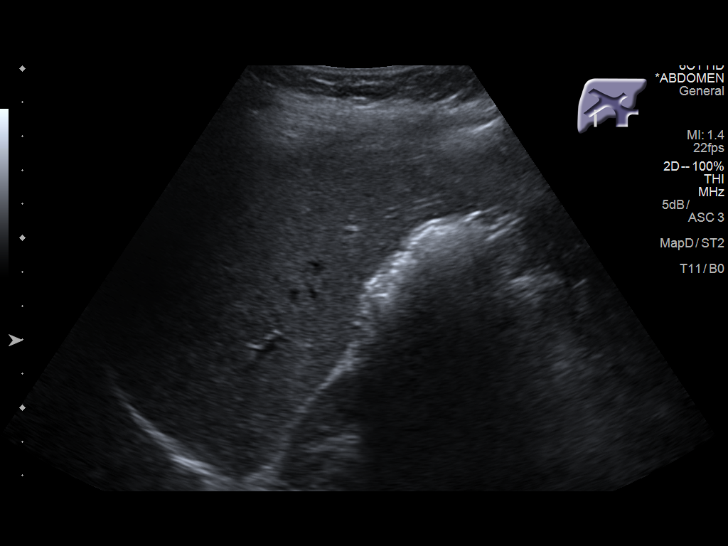
[im 26/68]
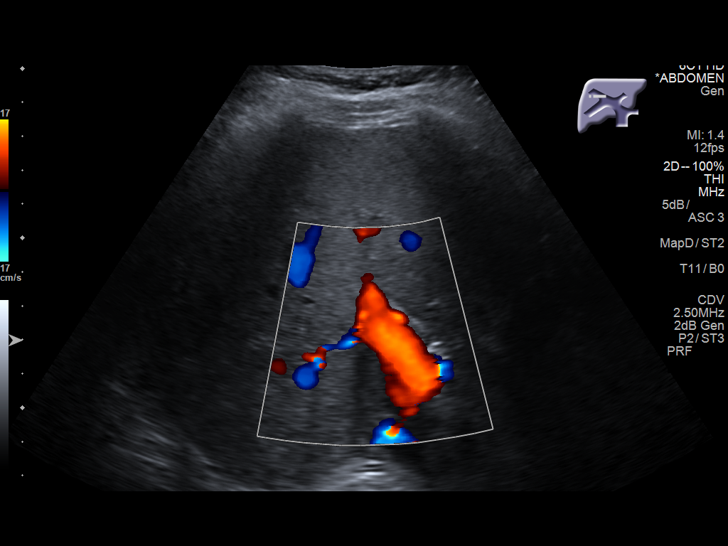
[im 31/68]
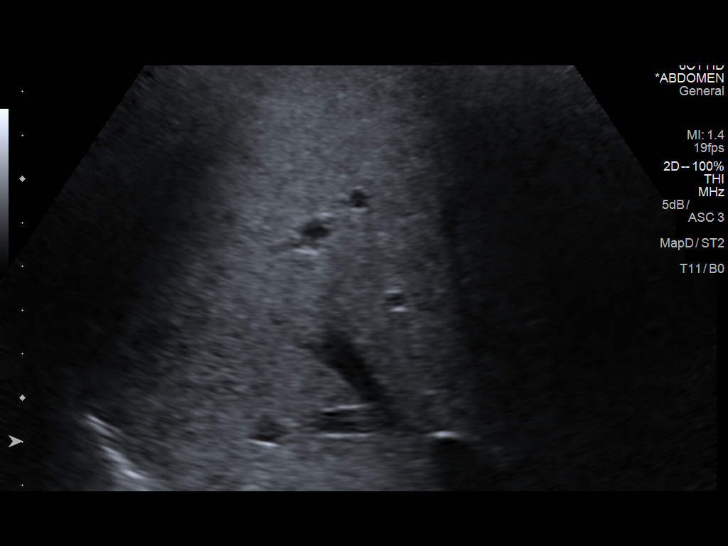
[im 37/68]
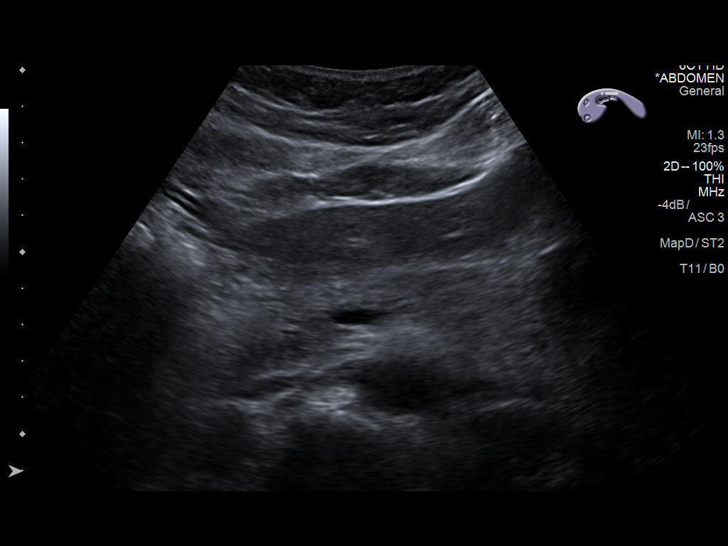
[im 42/68]
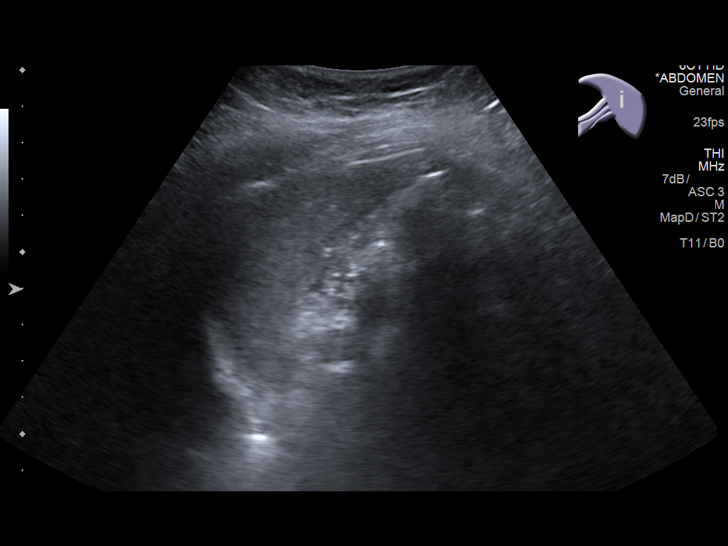
[im 45/68]
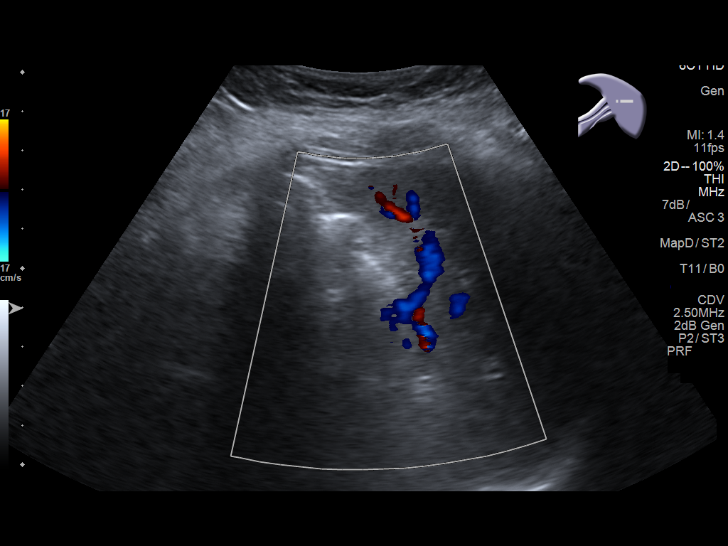
[im 51/68]
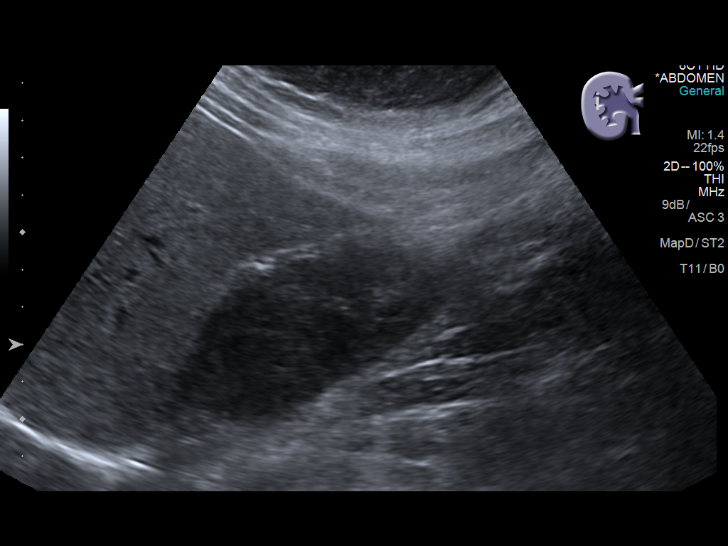
[im 56/68]
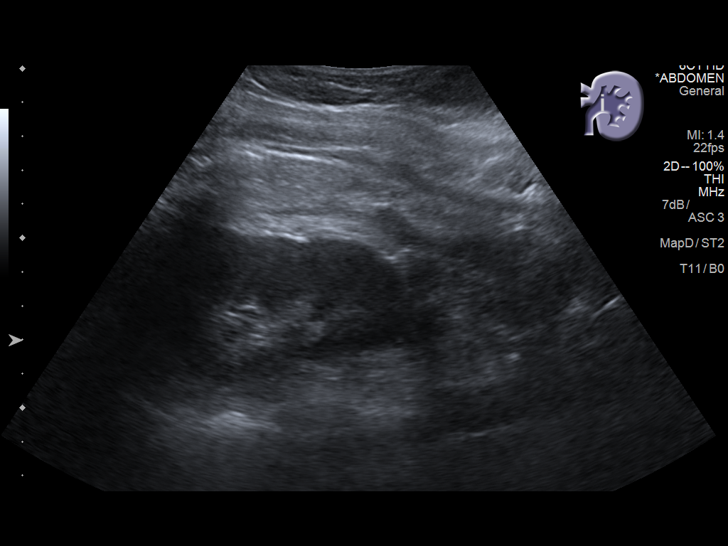
[im 62/68]
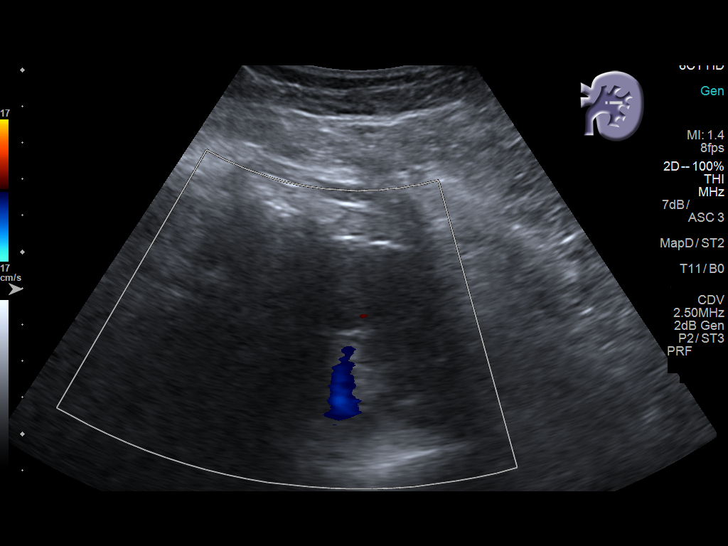
[im 68/68]
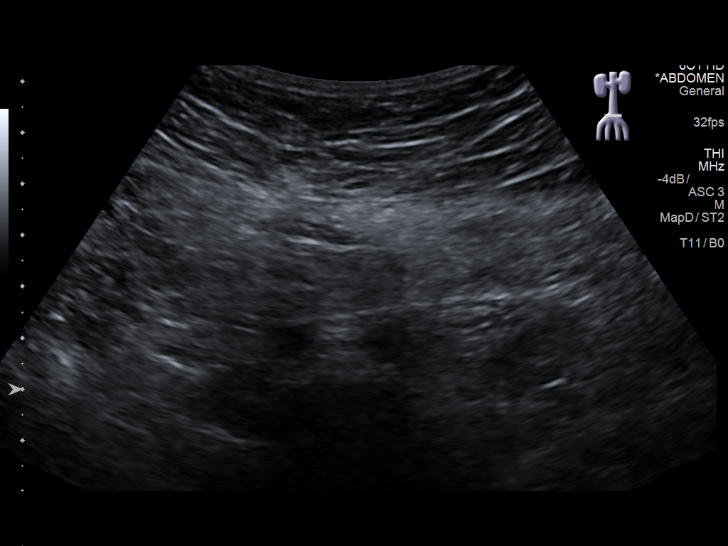

[14 of 25 positions shown; findings below may reference images not displayed]

FINDINGS: Gallbladder: No gallstones or wall thickening visualized. No
sonographic Murphy sign noted by sonographer.

Common bile duct: Diameter: 1 mm

Liver: Tiny cyst in the right hepatic lobe. No suspicious masses.
Portal vein is patent on color Doppler imaging with normal direction
of blood flow towards the liver.

IVC: No abnormality visualized.

Pancreas: Visualized portion unremarkable.

Spleen: Size and appearance within normal limits.

Right Kidney: Length: 10.3 cm. Echogenicity within normal limits. No
mass or hydronephrosis visualized.

Left Kidney: Length: 9.9 cm. Echogenicity within normal limits. No
mass or hydronephrosis visualized.

Abdominal aorta: No aneurysm visualized.

Other findings: None.
IMPRESSION: No cause for the patient's pain identified.

## 2021-02-08 ENCOUNTER — Inpatient Hospital Stay (HOSPITAL_BASED_OUTPATIENT_CLINIC_OR_DEPARTMENT_OTHER)
Admission: EM | Admit: 2021-02-08 | Discharge: 2021-02-10 | DRG: 759 | Disposition: A | Discharge status: Home / Self Care | Payer: 59 | Attending: Obstetrics | Admitting: Obstetrics

## 2021-02-08 ENCOUNTER — Emergency Department (HOSPITAL_BASED_OUTPATIENT_CLINIC_OR_DEPARTMENT_OTHER): Payer: 59

## 2021-02-08 ENCOUNTER — Other Ambulatory Visit: Payer: Self-pay

## 2021-02-08 DIAGNOSIS — R110 Nausea: Secondary | ICD-10-CM | POA: Diagnosis not present

## 2021-02-08 DIAGNOSIS — M62838 Other muscle spasm: Secondary | ICD-10-CM | POA: Diagnosis not present

## 2021-02-08 DIAGNOSIS — D251 Intramural leiomyoma of uterus: Secondary | ICD-10-CM | POA: Diagnosis present

## 2021-02-08 DIAGNOSIS — N7011 Chronic salpingitis: Principal | ICD-10-CM | POA: Diagnosis present

## 2021-02-08 DIAGNOSIS — Z20822 Contact with and (suspected) exposure to covid-19: Secondary | ICD-10-CM | POA: Diagnosis not present

## 2021-02-08 DIAGNOSIS — R102 Pelvic and perineal pain: Secondary | ICD-10-CM | POA: Diagnosis present

## 2021-02-08 DIAGNOSIS — N920 Excessive and frequent menstruation with regular cycle: Secondary | ICD-10-CM | POA: Diagnosis not present

## 2021-02-08 DIAGNOSIS — Z91040 Latex allergy status: Secondary | ICD-10-CM | POA: Diagnosis not present

## 2021-02-08 DIAGNOSIS — M1990 Unspecified osteoarthritis, unspecified site: Secondary | ICD-10-CM | POA: Diagnosis not present

## 2021-02-08 DIAGNOSIS — Z791 Long term (current) use of non-steroidal anti-inflammatories (NSAID): Secondary | ICD-10-CM

## 2021-02-08 DIAGNOSIS — J45909 Unspecified asthma, uncomplicated: Secondary | ICD-10-CM | POA: Diagnosis not present

## 2021-02-08 DIAGNOSIS — N7093 Salpingitis and oophoritis, unspecified: Secondary | ICD-10-CM | POA: Diagnosis present

## 2021-02-08 DIAGNOSIS — D500 Iron deficiency anemia secondary to blood loss (chronic): Secondary | ICD-10-CM | POA: Diagnosis present

## 2021-02-08 DIAGNOSIS — M3500 Sicca syndrome, unspecified: Secondary | ICD-10-CM | POA: Diagnosis not present

## 2021-02-08 DIAGNOSIS — D250 Submucous leiomyoma of uterus: Secondary | ICD-10-CM | POA: Diagnosis present

## 2021-02-08 DIAGNOSIS — Z981 Arthrodesis status: Secondary | ICD-10-CM | POA: Diagnosis not present

## 2021-02-08 DIAGNOSIS — N809 Endometriosis, unspecified: Secondary | ICD-10-CM | POA: Diagnosis not present

## 2021-02-08 DIAGNOSIS — R52 Pain, unspecified: Secondary | ICD-10-CM

## 2021-02-08 DIAGNOSIS — Z79899 Other long term (current) drug therapy: Secondary | ICD-10-CM

## 2021-02-08 DIAGNOSIS — R140 Abdominal distension (gaseous): Secondary | ICD-10-CM | POA: Diagnosis present

## 2021-02-08 DIAGNOSIS — G43909 Migraine, unspecified, not intractable, without status migrainosus: Secondary | ICD-10-CM | POA: Diagnosis not present

## 2021-02-08 LAB — CBC WITH DIFFERENTIAL/PLATELET
Abs Immature Granulocytes: 0.06 10*3/uL (ref 0.00–0.07)
Abs Immature Granulocytes: 0.05 10*3/uL (ref 0.00–0.07)
Basophils Absolute: 0 10*3/uL (ref 0.0–0.1)
Basophils Absolute: 0 10*3/uL (ref 0.0–0.1)
Basophils Relative: 0 %
Basophils Relative: 0 %
Eosinophils Absolute: 0 10*3/uL (ref 0.0–0.5)
Eosinophils Absolute: 0 10*3/uL (ref 0.0–0.5)
Eosinophils Relative: 0 %
Eosinophils Relative: 0 %
HCT: 31.9 % — ABNORMAL LOW (ref 36.0–46.0)
HCT: 31 % — ABNORMAL LOW (ref 36.0–46.0)
Hemoglobin: 9.8 g/dL — ABNORMAL LOW (ref 12.0–15.0)
Hemoglobin: 9.3 g/dL — ABNORMAL LOW (ref 12.0–15.0)
Immature Granulocytes: 0 %
Immature Granulocytes: 0 %
Lymphocytes Relative: 9 %
Lymphocytes Relative: 10 %
Lymphs Abs: 1.3 10*3/uL (ref 0.7–4.0)
Lymphs Abs: 1.4 10*3/uL (ref 0.7–4.0)
MCH: 22.3 pg — ABNORMAL LOW (ref 26.0–34.0)
MCH: 22.3 pg — ABNORMAL LOW (ref 26.0–34.0)
MCHC: 30.7 g/dL (ref 30.0–36.0)
MCHC: 30 g/dL (ref 30.0–36.0)
MCV: 72.7 fL — ABNORMAL LOW (ref 80.0–100.0)
MCV: 74.3 fL — ABNORMAL LOW (ref 80.0–100.0)
Monocytes Absolute: 0.9 10*3/uL (ref 0.1–1.0)
Monocytes Absolute: 1 10*3/uL (ref 0.1–1.0)
Monocytes Relative: 6 %
Monocytes Relative: 7 %
Neutro Abs: 12.8 10*3/uL — ABNORMAL HIGH (ref 1.7–7.7)
Neutro Abs: 10.9 10*3/uL — ABNORMAL HIGH (ref 1.7–7.7)
Neutrophils Relative %: 85 %
Neutrophils Relative %: 83 %
Platelets: 432 10*3/uL — ABNORMAL HIGH (ref 150–400)
Platelets: 342 10*3/uL (ref 150–400)
RBC: 4.39 MIL/uL (ref 3.87–5.11)
RBC: 4.17 MIL/uL (ref 3.87–5.11)
RDW: 17.4 % — ABNORMAL HIGH (ref 11.5–15.5)
RDW: 17.4 % — ABNORMAL HIGH (ref 11.5–15.5)
WBC: 15.1 10*3/uL — ABNORMAL HIGH (ref 4.0–10.5)
WBC: 13.3 10*3/uL — ABNORMAL HIGH (ref 4.0–10.5)
nRBC: 0 % (ref 0.0–0.2)
nRBC: 0 % (ref 0.0–0.2)

## 2021-02-08 LAB — COMPREHENSIVE METABOLIC PANEL
ALT: 15 U/L (ref 0–44)
AST: 19 U/L (ref 15–41)
Albumin: 4 g/dL (ref 3.5–5.0)
Alkaline Phosphatase: 64 U/L (ref 38–126)
Anion gap: 8 (ref 5–15)
BUN: 11 mg/dL (ref 6–20)
CO2: 25 mmol/L (ref 22–32)
Calcium: 9.2 mg/dL (ref 8.9–10.3)
Chloride: 100 mmol/L (ref 98–111)
Creatinine, Ser: 0.82 mg/dL (ref 0.44–1.00)
GFR, Estimated: >60 mL/min (ref >60)
Glucose, Bld: 119 mg/dL — ABNORMAL HIGH (ref 70–99)
Potassium: 3.9 mmol/L (ref 3.5–5.1)
Sodium: 133 mmol/L — ABNORMAL LOW (ref 135–145)
Total Bilirubin: 0.6 mg/dL (ref 0.3–1.2)
Total Protein: 8.3 g/dL — ABNORMAL HIGH (ref 6.5–8.1)

## 2021-02-08 LAB — URINALYSIS, ROUTINE W REFLEX MICROSCOPIC
Bilirubin Urine: NEGATIVE
Glucose, UA: NEGATIVE mg/dL
Ketones, ur: NEGATIVE mg/dL
Nitrite: NEGATIVE
Protein, ur: NEGATIVE mg/dL
Specific Gravity, Urine: 1.01 (ref 1.005–1.030)
pH: 6 (ref 5.0–8.0)

## 2021-02-08 LAB — URINALYSIS, MICROSCOPIC (REFLEX)

## 2021-02-08 LAB — WET PREP, GENITAL
Clue Cells Wet Prep HPF POC: NONE SEEN
Sperm: NONE SEEN
Trich, Wet Prep: NONE SEEN
Yeast Wet Prep HPF POC: NONE SEEN

## 2021-02-08 LAB — CBG MONITORING, ED: Glucose-Capillary: 113 mg/dL — ABNORMAL HIGH (ref 70–99)

## 2021-02-08 LAB — LIPASE, BLOOD: Lipase: 28 U/L (ref 11–51)

## 2021-02-08 LAB — PREGNANCY, URINE: Preg Test, Ur: NEGATIVE

## 2021-02-08 MED ORDER — SODIUM CHLORIDE 0.9 % IV BOLUS
1000.0000 mL | Freq: Once | INTRAVENOUS | Status: AC
  Administered 2021-02-08: 1000 mL via INTRAVENOUS

## 2021-02-08 MED ORDER — ONDANSETRON HCL 4 MG/2ML IJ SOLN
4.0000 mg | Freq: Four times a day (QID) | INTRAMUSCULAR | Status: DC | PRN
  Administered 2021-02-09 – 2021-02-10 (×2): 4 mg via INTRAVENOUS
  Filled 2021-02-08 (×3): qty 2

## 2021-02-08 MED ORDER — ONDANSETRON HCL 4 MG PO TABS: 4.0000 mg | ORAL_TABLET | Freq: Four times a day (QID) | ORAL | Status: DC | PRN

## 2021-02-08 MED ORDER — KETOROLAC TROMETHAMINE 30 MG/ML IJ SOLN
30.0000 mg | Freq: Four times a day (QID) | INTRAMUSCULAR | Status: DC
  Filled 2021-02-08 (×2): qty 1

## 2021-02-08 MED ORDER — MENTHOL 3 MG MT LOZG
1.0000 | LOZENGE | OROMUCOSAL | Status: DC | PRN
Start: 2021-02-08 — End: 2021-02-11
  Filled 2021-02-08: qty 9

## 2021-02-08 MED ORDER — HYDROMORPHONE HCL 1 MG/ML IJ SOLN
0.2000 mg | INTRAMUSCULAR | Status: DC | PRN
  Administered 2021-02-09 – 2021-02-10 (×3): 0.6 mg via INTRAVENOUS
  Filled 2021-02-08 (×3): qty 1

## 2021-02-08 MED ORDER — ONDANSETRON HCL 4 MG/2ML IJ SOLN
4.0000 mg | Freq: Once | INTRAMUSCULAR | Status: AC
  Administered 2021-02-08: 4 mg via INTRAVENOUS
  Filled 2021-02-08: qty 2

## 2021-02-08 MED ORDER — GUAIFENESIN 100 MG/5ML PO SOLN
15.0000 mL | ORAL | Status: DC | PRN
  Filled 2021-02-08: qty 15

## 2021-02-08 MED ORDER — KETOROLAC TROMETHAMINE 30 MG/ML IJ SOLN
30.0000 mg | Freq: Four times a day (QID) | INTRAMUSCULAR | Status: DC
  Administered 2021-02-08 – 2021-02-10 (×8): 30 mg via INTRAVENOUS
  Filled 2021-02-08 (×7): qty 1

## 2021-02-08 MED ORDER — SODIUM CHLORIDE 0.9% FLUSH
3.0000 mL | Freq: Two times a day (BID) | INTRAVENOUS | Status: DC
  Administered 2021-02-09 (×2): 3 mL via INTRAVENOUS

## 2021-02-08 MED ORDER — MORPHINE SULFATE (PF) 4 MG/ML IV SOLN
4.0000 mg | Freq: Once | INTRAVENOUS | Status: AC
  Administered 2021-02-08: 4 mg via INTRAVENOUS
  Filled 2021-02-08: qty 1

## 2021-02-08 MED ORDER — IOHEXOL 300 MG/ML  SOLN
100.0000 mL | Freq: Once | INTRAMUSCULAR | Status: AC
  Administered 2021-02-08: 100 mL via INTRAVENOUS

## 2021-02-08 MED ORDER — ZOLPIDEM TARTRATE 5 MG PO TABS
5.0000 mg | ORAL_TABLET | Freq: Every evening | ORAL | Status: DC | PRN
Start: 2021-02-08 — End: 2021-02-11

## 2021-02-08 MED ORDER — SODIUM CHLORIDE 0.9% FLUSH: 3.0000 mL | INTRAVENOUS | Status: DC | PRN

## 2021-02-08 MED ORDER — SODIUM CHLORIDE 0.9 % IV SOLN: 250.0000 mL | INTRAVENOUS | Status: DC | PRN

## 2021-02-08 MED ORDER — DOXYCYCLINE HYCLATE 100 MG PO TABS
100.0000 mg | ORAL_TABLET | Freq: Two times a day (BID) | ORAL | Status: DC
  Administered 2021-02-09 – 2021-02-10 (×3): 100 mg via ORAL
  Filled 2021-02-08 (×3): qty 1

## 2021-02-08 MED ORDER — SODIUM CHLORIDE 0.9 % IV SOLN
2.0000 g | Freq: Two times a day (BID) | INTRAVENOUS | Status: DC
  Administered 2021-02-08 – 2021-02-10 (×4): 2 g via INTRAVENOUS
  Filled 2021-02-08 (×5): qty 2

## 2021-02-08 MED ORDER — SODIUM CHLORIDE 0.9 % IV SOLN: INTRAVENOUS | Status: AC

## 2021-02-08 MED ORDER — MORPHINE SULFATE (PF) 4 MG/ML IV SOLN
4.0000 mg | Freq: Once | INTRAVENOUS | Status: AC
Start: 2021-02-08 — End: 2021-02-08
  Administered 2021-02-08: 4 mg via INTRAVENOUS
  Filled 2021-02-08: qty 1

## 2021-02-08 MED ORDER — OXYCODONE-ACETAMINOPHEN 5-325 MG PO TABS
1.0000 | ORAL_TABLET | ORAL | Status: DC | PRN
  Administered 2021-02-09: 1 via ORAL
  Filled 2021-02-08: qty 1

## 2021-02-08 NOTE — ED Notes (Signed)
toradol 30 mg im rt hip

## 2021-02-08 NOTE — ED Notes (Signed)
Report given to [facility] for transport to [facility] ED. ETA 20 mins. Report called to [employer] charge nurse for transfer.

## 2021-02-08 NOTE — ED Notes (Signed)
Pt ambulated to the restroom with a steady gait.

## 2021-02-08 NOTE — ED Notes (Signed)
Pr transferred from [facility] the pt arrived at 1750  Chart was not released from [facility]  Pt has had abd pain since yesterday  She reports that morphine does not help her pain .. she had morphine at med center.   Sent here for pelvic

## 2021-02-08 NOTE — ED Provider Notes (Signed)
Patient was initially seen by Dr. Almyra Free.  Please see his note.  Dr. Pamala Hurry called back and requested the patient be transferred to the [facility], ED.  She plans to evaluate the patient there and determine disposition at that time.  I spoke with Dr. Laverta Baltimore.  Will arrange transfer to Vangie Bicker, MD 02/08/21 [phone]

## 2021-02-08 NOTE — ED Provider Notes (Signed)
Patient is a 45 year old female who was transferred from [facility] for OB/GYN evaluation.  She had onset of pelvic pain.  Ultrasound shows hydrosalpinx.  She has been evaluated by OB/GYN service and will be admitted for further evaluation.   Malvin Johns, MD 02/08/21 [phone]

## 2021-02-08 NOTE — ED Provider Notes (Signed)
[facility] EMERGENCY DEPARTMENT Provider Note   CSN: [id number] Arrival date & time: 02/08/21  1047     History Chief Complaint  Patient presents with  . Abdominal Pain    Derek Huneycutt Ellwood Sayers is a 45 y.o. female.  Patient presents ER chief complaint of abdominal pain.  Describes as diffusely across her belly sharp and aching.  She is unable to say exactly where the pain is located or where in her abdomen the pain is more severe.  She states that it is worse when she pushes on her belly diffusely.  Denies fevers or cough.  No vomiting or diarrhea reported.  Had a history of endometriosis in the distant past surgical intervention.  She has had no pain for several years now until this episode of pain that started yesterday.  She otherwise denies any history of STDs in the past.        Past Medical History:  Diagnosis Date  . Arthritis   . Asthma   . Complication of anesthesia   . Dysrhythmia    as a child  . Headache    migraines  . Hypoglycemia   . Murmur    pt denies currently, as a child  . PONV (postoperative nausea and vomiting)   . Sjogren's disease ([place])   . Spinal stenosis     Patient Active Problem List   Diagnosis Date Noted  . Cervical vertebral fusion 10/29/2017    Past Surgical History:  Procedure Laterality Date  . ANTERIOR CERVICAL DECOMP/DISCECTOMY FUSION N/A 10/29/2017   Procedure: Anterior Cervical Decompression Fusion - Cervical four-Cervical five - Cervical five-Cervical six;  Surgeon: Eustace Moore, MD;  Location: [facility];  Service: Neurosurgery;  Laterality: N/A;  . FRACTURE SURGERY     anklex4  . laproscopic surgery to evaluate endometriosis       OB History   No obstetric history on file.     No family history on file.  Social History   Tobacco Use  . Smoking status: Never Smoker  . Smokeless tobacco: Never Used  Vaping Use  . Vaping Use: Never used  Substance Use Topics  . Alcohol use: Yes    Comment: occ   .  Drug use: No    Home Medications Prior to Admission medications   Medication Sig Start Date End Date Taking? Authorizing Provider  ergocalciferol (VITAMIN D2) 50000 units capsule Take 50,000 Units by mouth once a week. Mondays   Yes [provider]  albuterol (PROVENTIL HFA;VENTOLIN HFA) 108 (90 Base) MCG/ACT inhaler Inhale 2 puffs into the lungs every 6 (six) hours as needed for wheezing or shortness of breath.    [provider]  aspirin-acetaminophen-caffeine (EXCEDRIN MIGRAINE) [id number] MG tablet Take 2 tablets by mouth 2 (two) times daily as needed for migraine.     [provider]  Aspirin-Acetaminophen-Caffeine (PAMPRIN MAX PO) Take 3 tablets by mouth daily as needed (CRAMPS).    [provider]  cyclobenzaprine (FLEXERIL) 10 MG tablet Take 1 tablet (10 mg total) by mouth daily as needed for muscle spasms. 10/30/17   Meyran, Ocie Cornfield, NP  HYDROcodone-acetaminophen (NORCO) 7.5-325 MG tablet Take 2 tablets by mouth every 4 (four) hours as needed for severe pain ((score 7 to 10)). 10/30/17   Meyran, Ocie Cornfield, NP  ibuprofen (ADVIL,MOTRIN) 200 MG tablet Take 600 mg by mouth every 8 (eight) hours as needed for mild pain.    [provider]  meloxicam (MOBIC) 15 MG tablet Take 15  mg by mouth daily as needed for pain.  09/21/17   [provider]  methocarbamol (ROBAXIN) 500 MG tablet Take 1 tablet (500 mg total) by mouth 2 (two) times daily. 08/12/19   Jacqlyn Larsen, PA-C  naproxen (NAPROSYN) 500 MG tablet Take 1 tablet (500 mg total) by mouth 2 (two) times daily. 08/12/19   Jacqlyn Larsen, PA-C  naproxen sodium (ALEVE) 220 MG tablet Take 440 mg by mouth 2 (two) times daily as needed (PAIN).    [provider]  Prenatal MV-Min-FA-Omega-3 (PRENATAL GUMMIES/DHA & FA PO) Take 2 each by mouth daily.    [provider]  SUMAtriptan (IMITREX) 50 MG tablet TAKE 1 TABLET BY MOUTH  EVRY 2 HOURS AS NEEDED MIGRAINE 08/22/17    [provider]    Allergies    Latex  Review of Systems   Review of Systems  Constitutional: Negative for fever.  HENT: Negative for ear pain.   Eyes: Negative for pain.  Respiratory: Negative for cough.   Cardiovascular: Negative for chest pain.  Gastrointestinal: Positive for abdominal pain.  Genitourinary: Negative for flank pain.  Musculoskeletal: Negative for back pain.  Skin: Negative for rash.  Neurological: Negative for headaches.    Physical Exam Updated Vital Signs BP (!) 102/55 (BP Location: Right Arm)   Pulse 87   Temp 98.5 F (36.9 C) (Oral)   Resp 16   Ht 5\' 2"  (1.575 m)   Wt 82.1 kg   SpO2 100%   BMI 33.11 kg/m   Physical Exam Constitutional:      General: She is not in acute distress.    Appearance: Normal appearance.  HENT:     Head: Normocephalic.     Nose: Nose normal.  Eyes:     Extraocular Movements: Extraocular movements intact.  Cardiovascular:     Rate and Rhythm: Normal rate.  Pulmonary:     Effort: Pulmonary effort is normal.  Abdominal:     Tenderness: There is generalized abdominal tenderness. There is guarding and rebound.  Musculoskeletal:        General: Normal range of motion.     Cervical back: Normal range of motion.  Neurological:     General: No focal deficit present.     Mental Status: She is alert. Mental status is at baseline.     ED Results / Procedures / Treatments   Labs (all labs ordered are listed, but only abnormal results are displayed) Labs Reviewed  CBC WITH DIFFERENTIAL/PLATELET - Abnormal; Notable for the following components:      Result Value   WBC 15.1 (*)    Hemoglobin 9.8 (*)    HCT 31.9 (*)    MCV 72.7 (*)    MCH 22.3 (*)    RDW 17.4 (*)    Platelets 432 (*)    Neutro Abs 12.8 (*)    All other components within normal limits  COMPREHENSIVE METABOLIC PANEL - Abnormal; Notable for the following components:   Sodium 133 (*)    Glucose, Bld 119 (*)    Total Protein 8.3 (*)    All  other components within normal limits  URINALYSIS, ROUTINE W REFLEX MICROSCOPIC - Abnormal; Notable for the following components:   Hgb urine dipstick MODERATE (*)    Leukocytes,Ua SMALL (*)    All other components within normal limits  URINALYSIS, MICROSCOPIC (REFLEX) - Abnormal; Notable for the following components:   Bacteria, UA FEW (*)    All other components within normal limits  CBG MONITORING, ED - Abnormal; Notable for the following components:   Glucose-Capillary 113 (*)    All other components within normal limits  LIPASE, BLOOD  PREGNANCY, URINE    EKG None  Radiology CT Abdomen Pelvis W Contrast  Result Date: 02/08/2021 CLINICAL DATA:  Abdominal pain EXAM: CT ABDOMEN AND PELVIS WITH CONTRAST TECHNIQUE: Multidetector CT imaging of the abdomen and pelvis was performed using the standard protocol following bolus administration of intravenous contrast. CONTRAST:  123mL OMNIPAQUE IOHEXOL 300 MG/ML  SOLN COMPARISON:  October 2020 FINDINGS: Lower chest: No acute abnormality. Hepatobiliary: Too small to characterize low-attenuation lesion the left hepatic lobe is again identified. Liver is otherwise unremarkable. Gallbladder is unremarkable. No biliary dilatation. Pancreas: Unremarkable. Spleen: Unremarkable. Adrenals/Urinary Tract: Adrenals, kidneys, and bladder are unremarkable. Stomach/Bowel: Stomach is within normal limits. Bowel is normal in caliber. Distal colonic diverticulosis. Normal appendix. Vascular/Lymphatic: No significant vascular abnormality. No enlarged lymph nodes. Reproductive: Uterus is unremarkable. Low attenuation tubular right adnexal lesion suggesting hydrosalpinx. Wall enhancement may reflect inflammation. There is small volume surrounding free fluid extending into the cul-de-sac. Other: Free fluid as above.  Abdominal wall is unremarkable. Musculoskeletal: No acute osseous abnormality. IMPRESSION: Tubular right adnexal lesion may reflect hydrosalpinx possibly with  inflammation. Small volume surrounding free fluid. Pelvic ultrasound is recommended. Distal colonic diverticulosis. Electronically Signed   By: Macy Mis M.D.   On: 02/08/2021 13:07   US PELVIC COMPLETE W TRANSVAGINAL AND TORSION R/O  Result Date: 02/08/2021 CLINICAL DATA:  Right-sided pelvic pain. EXAM: TRANSABDOMINAL AND TRANSVAGINAL ULTRASOUND OF PELVIS DOPPLER ULTRASOUND OF OVARIES TECHNIQUE: Both transabdominal and transvaginal ultrasound examinations of the pelvis were performed. Transabdominal technique was performed for global imaging of the pelvis including uterus, ovaries, adnexal regions, and pelvic cul-de-sac. It was necessary to proceed with endovaginal exam following the transabdominal exam to visualize the endometrium and ovaries. Color and duplex Doppler ultrasound was utilized to evaluate blood flow to the ovaries. COMPARISON:  CT of same day. FINDINGS: Uterus Measurements: 7.1 x 4.6 x 4.3 cm = volume: 86 mL. Probable 1.8 cm fibroid is noted in the fundus. Endometrium Thickness: 14 mm.  No focal abnormality visualized. Right ovary Measurements: 4.0 x 3.3 x 2.7 cm = volume: 19 mL. Normal appearance/no adnexal mass. Left ovary Measurements: 3.4 x 2.6 x 2.1 cm = volume: 10 mL. Serpiginous tubular hypoechoic structure is noted in the right adnexal region most consistent with hydrosalpinx as noted on prior CT scan. Pulsed Doppler evaluation of both ovaries demonstrates normal low-resistance arterial and venous waveforms. Other findings Small amount of free fluid is noted which most likely is physiologic. IMPRESSION: Probable right-sided hydrosalpinx is noted. Probable 1.8 cm uterine fibroid. No evidence of ovarian mass or torsion. Electronically Signed   By: Marijo Conception M.D.   On: 02/08/2021 14:46    Procedures Procedures   Medications Ordered in ED Medications  morphine 4 MG/ML injection 4 mg (4 mg Intravenous Given 02/08/21 1136)  ondansetron (ZOFRAN) injection 4 mg (4 mg Intravenous  Given 02/08/21 1136)  sodium chloride 0.9 % bolus 1,000 mL (0 mLs Intravenous Stopped 02/08/21 1448)  iohexol (OMNIPAQUE) 300 MG/ML solution 100 mL (100 mLs Intravenous Contrast Given 02/08/21 1224)  morphine 4 MG/ML injection 4 mg (4 mg Intravenous Given 02/08/21 1450)    ED Course  I have reviewed the triage vital signs and the nursing notes.  Pertinent labs & imaging results that were available during my care of the patient were reviewed by me and considered in  my medical decision making (see chart for details).    MDM Rules/Calculators/A&P                          Labs show white count of 15.  Vital signs initially within normal limits.  CT of the pelvis pursued, concerning for hydrosalpinx.  Ultrasound also concerning for hydrosalpinx.  Case discussed with on-call OB physician from [facility] Dr Pamala Hurry.  Patient signed out to oncoming physician provider pending specialist input.  Final Clinical Impression(s) / ED Diagnoses Final diagnoses:  Pelvic pain    Rx / DC Orders ED Discharge Orders    None       Luna Fuse, MD 02/08/21 1536

## 2021-02-08 NOTE — ED Notes (Signed)
Dinner tray is ordered and on the way

## 2021-02-08 NOTE — ED Notes (Signed)
Pt requests ambulance transport for transfer

## 2021-02-08 NOTE — ED Notes (Signed)
Pt requested pain Rx - MD advised - meds ordered and given Pt mother given drinks and snacks - has DM2 and needs food

## 2021-02-08 NOTE — ED Triage Notes (Signed)
Patient with diffuse abdominal pain starting yesterday evening. Denies nausea/vomiting. Unable to eat much and complains of dizziness. LBM 02/06/21. Took laxatives last night. Passing minimal flatus. Patient has been getting full with meals for the last 2 weeks and complains of post-meal bloating.

## 2021-02-08 NOTE — H&P (Signed)
Chief complaint: Pelvic pain  History of present illness: 45 year old G0 with history of endometriosis, irregular menses with menorrhagia and dysmenorrhea and recent finding of 1.7 x 1.6 cm cavitary fibroid presents with acute onset abdominal pain last night.  Patient notes acute onset of mid pelvic pain spreading to entire pelvis starting last night.  This pain kept her up overnight.  She reports mild nausea but no emesis.  Initially she thought her pain may have been due to constipation and she took stool softeners and had a small bowel movement with no relief in her symptoms.  She has not had any fevers.  She has not had any vaginal bleeding.   She called her primary care doctor this morning who sent her to the ER for CT scan for evaluation of colitis.  In the ER patient had a negative urine pregnancy test hemoglobin 9.8, white count 15.1, normal CT scan without evidence of appendicitis or diverticulitis and notation of a right-sided hydrosalpinx without mention of tubo-ovarian abscess.  Ultrasound shows flow to bilateral ovaries and no evidence of ovarian torsion.  Her exam in the ER significant for significant abdominal pain requiring several doses of morphine but a nonacute abdomen.  Pelvic exam was not done.  Patient was transferred for further gynecologic evaluation  Patient reports last menstrual period  2 weeks ago and normal for her.  Patient has had no intermenstrual bleeding.  Patient is in a stable monogamous relationship and had negative gonorrhea chlamydia and trichomonas testing 2 months ago.  Patient is not planning pregnancy and has been using withdrawal as her means of contraception.  Patient has not used hormonal contraception in the past due to history of migraine.  Patient notes intercourse several days ago with increased discomfort but no bleeding.  No subsequent vaginal discharge.  Patient currently complaining of migraine headache.  Patient reports endometriosis diagnosed in her  45s by laparoscopy.  Patient reports history of ovarian cyst x1 in her 45s which resolved on its own.  Hemoglobin in the past several months 10.6, normal TSH, FSH at 7 and an antimalarial ammonia level at 0.1.  Patient had a normal Pap smear in June 2021.  Patient was treated for bacterial vaginosis November 22, 2020  Ultrasound December 17, 2020 uterus 7 x 6 x 4 cm left-sided hydrosalpinx, 2 intramural fibroids and 1 fundal submucosal fibroid.  Follow-up sonohysterogram showed 1.7 x 1.6 cm cavitary fibroid.  Past Medical History:  Diagnosis Date  . Arthritis   . Asthma   . Complication of anesthesia   . Dysrhythmia    as a child  . Headache    migraines  . Hypoglycemia   . Murmur    pt denies currently, as a child  . PONV (postoperative nausea and vomiting)   . Sjogren's disease ([place])   . Spinal stenosis     Past Surgical History:  Procedure Laterality Date  . ANTERIOR CERVICAL DECOMP/DISCECTOMY FUSION N/A 10/29/2017   Procedure: Anterior Cervical Decompression Fusion - Cervical four-Cervical five - Cervical five-Cervical six;  Surgeon: Eustace Moore, MD;  Location: [facility];  Service: Neurosurgery;  Laterality: N/A;  . FRACTURE SURGERY     anklex4  . laproscopic surgery to evaluate endometriosis      Social history: Married.  Declines blood products  NKDA  PE: Vitals with BMI 02/08/2021 02/08/2021 02/08/2021  Height - - -  Weight - - -  BMI - - -  Systolic 309 407 680  Diastolic 61 61 76  Pulse 91 90 92   General: Uncomfortable but not in distress.  Tearful and jumpy on exam Abdomen: Generalized discomfort to deep palpation in all 4 quadrants, no specific right upper quadrant pain.  No rebound, no guarding, no distention Back: No costovertebral angle tenderness GU: Normal external genitalia, normal vagina without discharge masses blood or erythema.  Normal-appearing cervix.  On bimanual exam no specific bladder tenderness.  Cervical motion tenderness present but patient diffusely  tender on bimanual exam on palpating cervix uterus and bilateral adnexa.  No masses palpated Lower extremity: Nontender, no edema  CBC    Component Value Date/Time   WBC 15.1 (H) 02/08/2021 1130   RBC 4.39 02/08/2021 1130   HGB 9.8 (L) 02/08/2021 1130   HCT 31.9 (L) 02/08/2021 1130   PLT 432 (H) 02/08/2021 1130   MCV 72.7 (L) 02/08/2021 1130   MCH 22.3 (L) 02/08/2021 1130   MCHC 30.7 02/08/2021 1130   RDW 17.4 (H) 02/08/2021 1130   LYMPHSABS 1.3 02/08/2021 1130   MONOABS 0.9 02/08/2021 1130   EOSABS 0.0 02/08/2021 1130   BASOSABS 0.0 02/08/2021 1130    CMP     Component Value Date/Time   NA 133 (L) 02/08/2021 1130   K 3.9 02/08/2021 1130   CL 100 02/08/2021 1130   CO2 25 02/08/2021 1130   GLUCOSE 119 (H) 02/08/2021 1130   BUN 11 02/08/2021 1130   CREATININE 0.82 02/08/2021 1130   CALCIUM 9.2 02/08/2021 1130   PROT 8.3 (H) 02/08/2021 1130   ALBUMIN 4.0 02/08/2021 1130   AST 19 02/08/2021 1130   ALT 15 02/08/2021 1130   ALKPHOS 64 02/08/2021 1130   BILITOT 0.6 02/08/2021 1130   GFRNONAA >60 02/08/2021 1130   GFRAA >60 08/12/2019 1654    Wet prep pending Gonorrhea chlamydia: Pending CT scan tubular right adnexal lesion may reflect hydrosalpinx possibly with inflammation.  Small volume free fluid uterus is unremarkable.  Distal colonic diverticulosis .  Normal appendix. otherwise unremarkable abdomen and pelvic CT Pelvic ultrasound: Right hydrosalpinx, no evidence of ovarian torsion, 1.8 cm uterine fibroid  Assessment plan: 45 year old G0 nonpregnant patient with acute onset abdominal pain last night in the setting of known hydrosalpinx, endometriosis and cavitary fibroid. -Unclear etiology of her acute pain.  Her global abdominal and pelvic tenderness may be due to tubo-ovarian abscess or pelvic inflammatory disease, especially given her cervical motion tenderness however she does not appear to have leukorrhea and is lower risk for PID.  She did have sonohysterogram about  3 weeks ago which does risk ascending infection.  Her white count of 15 is concerning.  Her CT and ultrasound were not consistent with tubo-ovarian abscess.  Her exam is difficult due to the 24 hours of suboptimal pain control and poor sleep with no food.  I think she will have a more telling exam in the a.m. after rest and regular diet tonight.  Will treat with antibiotics for possible PID or tubo-ovarian abscess.  While she has a hydrosalpinx this has been seen on prior ultrasounds, though laterality is different on different reports, and these are an unlikely cause of acute abdominal pain.  The hydrosalpinx is likely related to her endometriosis and her current pain could be an acute endometriosis flare for which to the best therapy is pain control.  Lastly she was found to have intracavitary fibroid on sonohysterogram last month and her acute pain may be due to uterine cramping and attempts to expulse her fibroid.  Her cervix is closed  on exam and her imaging did not reveal an expulsing fibroid.  Again we will start with pain control and reevaluation tomorrow.  Ruptured ovarian cyst this also possibility but there is no significant free fluid on her imaging.  Her hemoglobin was 9.8 and we will repeat that for stability tonight and again in the a.m. especially as patient refuses blood products. -Migraine.  Patient states she does not do well with sumatriptan.  Will give Toradol and allow patient to take her own Nurtec.  Ala Dach 02/08/2021 7:42 PM

## 2021-02-09 DIAGNOSIS — G43909 Migraine, unspecified, not intractable, without status migrainosus: Secondary | ICD-10-CM | POA: Diagnosis present

## 2021-02-09 DIAGNOSIS — Z91040 Latex allergy status: Secondary | ICD-10-CM | POA: Diagnosis not present

## 2021-02-09 DIAGNOSIS — R140 Abdominal distension (gaseous): Secondary | ICD-10-CM | POA: Diagnosis present

## 2021-02-09 DIAGNOSIS — R102 Pelvic and perineal pain: Secondary | ICD-10-CM | POA: Diagnosis present

## 2021-02-09 DIAGNOSIS — N809 Endometriosis, unspecified: Secondary | ICD-10-CM | POA: Diagnosis present

## 2021-02-09 DIAGNOSIS — R110 Nausea: Secondary | ICD-10-CM | POA: Diagnosis present

## 2021-02-09 DIAGNOSIS — N920 Excessive and frequent menstruation with regular cycle: Secondary | ICD-10-CM | POA: Diagnosis present

## 2021-02-09 DIAGNOSIS — D251 Intramural leiomyoma of uterus: Secondary | ICD-10-CM | POA: Diagnosis present

## 2021-02-09 DIAGNOSIS — N7093 Salpingitis and oophoritis, unspecified: Secondary | ICD-10-CM | POA: Diagnosis present

## 2021-02-09 DIAGNOSIS — Z20822 Contact with and (suspected) exposure to covid-19: Secondary | ICD-10-CM | POA: Diagnosis present

## 2021-02-09 DIAGNOSIS — Z791 Long term (current) use of non-steroidal anti-inflammatories (NSAID): Secondary | ICD-10-CM | POA: Diagnosis not present

## 2021-02-09 DIAGNOSIS — D500 Iron deficiency anemia secondary to blood loss (chronic): Secondary | ICD-10-CM | POA: Diagnosis present

## 2021-02-09 DIAGNOSIS — N7011 Chronic salpingitis: Secondary | ICD-10-CM | POA: Diagnosis present

## 2021-02-09 DIAGNOSIS — M3500 Sicca syndrome, unspecified: Secondary | ICD-10-CM | POA: Diagnosis present

## 2021-02-09 DIAGNOSIS — Z79899 Other long term (current) drug therapy: Secondary | ICD-10-CM | POA: Diagnosis not present

## 2021-02-09 DIAGNOSIS — M1990 Unspecified osteoarthritis, unspecified site: Secondary | ICD-10-CM | POA: Diagnosis present

## 2021-02-09 DIAGNOSIS — D250 Submucous leiomyoma of uterus: Secondary | ICD-10-CM | POA: Diagnosis present

## 2021-02-09 DIAGNOSIS — J45909 Unspecified asthma, uncomplicated: Secondary | ICD-10-CM | POA: Diagnosis present

## 2021-02-09 DIAGNOSIS — Z981 Arthrodesis status: Secondary | ICD-10-CM | POA: Diagnosis not present

## 2021-02-09 DIAGNOSIS — M62838 Other muscle spasm: Secondary | ICD-10-CM | POA: Diagnosis present

## 2021-02-09 LAB — CBC WITH DIFFERENTIAL/PLATELET
Abs Immature Granulocytes: 0.04 10*3/uL (ref 0.00–0.07)
Abs Immature Granulocytes: 0.05 10*3/uL (ref 0.00–0.07)
Basophils Absolute: 0 10*3/uL (ref 0.0–0.1)
Basophils Absolute: 0 10*3/uL (ref 0.0–0.1)
Basophils Relative: 0 %
Basophils Relative: 0 %
Eosinophils Absolute: 0.1 10*3/uL (ref 0.0–0.5)
Eosinophils Absolute: 0.1 10*3/uL (ref 0.0–0.5)
Eosinophils Relative: 1 %
Eosinophils Relative: 1 %
HCT: 27.4 % — ABNORMAL LOW (ref 36.0–46.0)
HCT: 26.2 % — ABNORMAL LOW (ref 36.0–46.0)
Hemoglobin: 8.2 g/dL — ABNORMAL LOW (ref 12.0–15.0)
Hemoglobin: 7.9 g/dL — ABNORMAL LOW (ref 12.0–15.0)
Immature Granulocytes: 0 %
Immature Granulocytes: 0 %
Lymphocytes Relative: 16 %
Lymphocytes Relative: 9 %
Lymphs Abs: 1.8 10*3/uL (ref 0.7–4.0)
Lymphs Abs: 1.2 10*3/uL (ref 0.7–4.0)
MCH: 22.2 pg — ABNORMAL LOW (ref 26.0–34.0)
MCH: 22.4 pg — ABNORMAL LOW (ref 26.0–34.0)
MCHC: 29.9 g/dL — ABNORMAL LOW (ref 30.0–36.0)
MCHC: 30.2 g/dL (ref 30.0–36.0)
MCV: 74.1 fL — ABNORMAL LOW (ref 80.0–100.0)
MCV: 74.2 fL — ABNORMAL LOW (ref 80.0–100.0)
Monocytes Absolute: 1 10*3/uL (ref 0.1–1.0)
Monocytes Absolute: 1.2 10*3/uL — ABNORMAL HIGH (ref 0.1–1.0)
Monocytes Relative: 8 %
Monocytes Relative: 9 %
Neutro Abs: 8.5 10*3/uL — ABNORMAL HIGH (ref 1.7–7.7)
Neutro Abs: 11.1 10*3/uL — ABNORMAL HIGH (ref 1.7–7.7)
Neutrophils Relative %: 75 %
Neutrophils Relative %: 81 %
Platelets: 378 10*3/uL (ref 150–400)
Platelets: 345 10*3/uL (ref 150–400)
RBC: 3.7 MIL/uL — ABNORMAL LOW (ref 3.87–5.11)
RBC: 3.53 MIL/uL — ABNORMAL LOW (ref 3.87–5.11)
RDW: 17.3 % — ABNORMAL HIGH (ref 11.5–15.5)
RDW: 17.4 % — ABNORMAL HIGH (ref 11.5–15.5)
WBC: 11.5 10*3/uL — ABNORMAL HIGH (ref 4.0–10.5)
WBC: 13.6 10*3/uL — ABNORMAL HIGH (ref 4.0–10.5)
nRBC: 0 % (ref 0.0–0.2)
nRBC: 0 % (ref 0.0–0.2)

## 2021-02-09 LAB — SARS CORONAVIRUS 2 (TAT 6-24 HRS): SARS Coronavirus 2: NEGATIVE

## 2021-02-09 MED ORDER — METRONIDAZOLE 500 MG PO TABS
500.0000 mg | ORAL_TABLET | Freq: Two times a day (BID) | ORAL | Status: DC
  Administered 2021-02-09 – 2021-02-10 (×3): 500 mg via ORAL
  Filled 2021-02-09 (×2): qty 1

## 2021-02-09 MED ORDER — SODIUM CHLORIDE 0.9 % IV SOLN
510.0000 mg | Freq: Once | INTRAVENOUS | Status: AC
  Administered 2021-02-09: 510 mg via INTRAVENOUS
  Filled 2021-02-09 (×2): qty 17

## 2021-02-09 NOTE — Progress Notes (Signed)
Arrived to 6N08, alert and oriented x4, able to make needs known. Denies N/V and has 9/10 abdominal pain-PRN med given. V/S stable. Patient is Jehovah's Witness. Patient was oriented to call bell and surroundings. Will continue to close monitor patient.

## 2021-02-09 NOTE — ED Notes (Signed)
Report given to rn on 6n      

## 2021-02-09 NOTE — Progress Notes (Signed)
Hospital day #2; pelvic pain  Subjective: Patient notes able to sleep off and on last night after arriving to her hospital room at 2 AM.  Patient notes Dilaudid helps pain.  Mild intermittent nausea which improved with Zofran.  No emesis.  Migraine headache has improved with rest.  Patient did tolerate regular p.o. last night and feels hungry today.  Positive flatus.  Ambulating without dizziness.  No chest pain or tachycardia.  Normal voids.  Patient notes no pain when lying still but pelvic pain with any movement.  Patient reports no vaginal bleeding.  Objective: Vitals with BMI 02/09/2021 02/09/2021 02/09/2021  Height - - -  Weight - - -  BMI - - -  Systolic 92 99 96  Diastolic 51 58 61  Pulse 66 68 86   General: Laying in bed, still, no acute distress Cardiovascular: Regular rate and rhythm Pulmonary: Clear to auscultation bilaterally Skin: Warm and dry Abdomen: Tender to deep palpation in lower pelvis.,  No rebound, no guarding, no distention Lower extremity: Nontender, no edema GU: No blood, no abnormal discharge, no bladder tenderness, cervical motion tenderness present with significant uterine tenderness.  No adnexal masses palpated  CBC Latest Ref Rng & Units 02/09/2021 02/08/2021 02/08/2021  WBC 4.0 - 10.5 K/uL 11.5(H) 13.3(H) 15.1(H)  Hemoglobin 12.0 - 15.0 g/dL 8.2(L) 9.3(L) 9.8(L)  Hematocrit 36.0 - 46.0 % 27.4(L) 31.0(L) 31.9(L)  Platelets 150 - 400 K/uL 378 342 432(H)   Assessment and plan: 45 year old G0 with acute onset abdominal pelvic pain 3 weeks after sonohysterogram. -Pelvic pain.  Given elevated white count, recent uterine instrumentation, and white blood cells on wet prep clinical picture most consistent with pelvic inflammatory disease and possible development of tubo-ovarian abscess.  Ultrasound and CT scan done yesterday show boggy uterus, dilated right fallopian tube with wall thickening and boggy right ovary.  Small amount of free fluid and on discussion with radiologist  today this appears more like a pyosalpinx but they are not seeing evidence of tubo-ovarian abscess.  From a gynecologic perspective would treat pyosalpinx identical to tubo-ovarian abscess with plans for IV antibiotics until improvement the 2 weeks home antibiotics with close outpatient follow-up.  Per radiology no current drainable abscess.  Will defer surgical management as long as patient stable and/or improving.  Initially thought pelvic pain may be due to expulsion of cavitary fibroid but on imaging fibroid still appears to be at the fundus and on exam no cervical dilation and by history pain is constant not in waves like contractions.  Pending clinical status may consider myomectomy at the time of salpingectomy but need to consider patient's anemia and refusal of blood products. -Anemia.  Drop in hemoglobin from 9.8-8.2 but patient remains clinically stable.  Surgical risks high at this time given active infection and would prefer to defer surgical management.  Per imaging no evidence of active bleeding.  Will give IV iron now. -PID/TOA.  Antibiotic regimen cefotetan 2 g IV every 12 hours plus doxy 100 mg p.o. twice daily.  Have added Flagyl 500 mg p.o. twice daily this a.m. given history of BV infection.  Gonorrhea chlamydia cultures pending.  As long as patient improves after 48 hours of antibiotics will plan doxy/Flagyl for 14 days at discharge.  Ala Dach 02/09/2021 10:13 AM

## 2021-02-10 ENCOUNTER — Inpatient Hospital Stay (HOSPITAL_COMMUNITY): Payer: 59

## 2021-02-10 ENCOUNTER — Encounter (HOSPITAL_COMMUNITY): Payer: Self-pay | Admitting: *Deleted

## 2021-02-10 LAB — GC/CHLAMYDIA PROBE AMP (~~LOC~~) NOT AT ARMC
Chlamydia: NEGATIVE
Comment: NEGATIVE
Comment: NORMAL
Neisseria Gonorrhea: NEGATIVE

## 2021-02-10 MED ORDER — PANTOPRAZOLE SODIUM 40 MG IV SOLR
40.0000 mg | INTRAVENOUS | Status: DC
  Administered 2021-02-10: 40 mg via INTRAVENOUS
  Filled 2021-02-10: qty 40

## 2021-02-10 MED ORDER — ONDANSETRON HCL 4 MG PO TABS
4.0000 mg | ORAL_TABLET | Freq: Four times a day (QID) | ORAL | 0 refills | Status: AC | PRN
Start: 2021-02-10 — End: ?

## 2021-02-10 MED ORDER — DOXYCYCLINE HYCLATE 100 MG PO TABS: 100.0000 mg | ORAL_TABLET | Freq: Two times a day (BID) | ORAL | 0 refills | Status: AC

## 2021-02-10 MED ORDER — NORETHINDRONE ACETATE 5 MG PO TABS: 5.0000 mg | ORAL_TABLET | Freq: Two times a day (BID) | ORAL | 1 refills | Status: AC

## 2021-02-10 MED ORDER — SODIUM CHLORIDE 0.9 % IV SOLN
1.0000 mg | Freq: Once | INTRAVENOUS | Status: AC
  Administered 2021-02-10: 1 mg via INTRAVENOUS
  Filled 2021-02-10: qty 0.2

## 2021-02-10 MED ORDER — OXYCODONE-ACETAMINOPHEN 5-325 MG PO TABS: 1.0000 | ORAL_TABLET | ORAL | 0 refills | Status: DC | PRN

## 2021-02-10 MED ORDER — EPOETIN ALFA 40000 UNIT/ML IJ SOLN
40000.0000 [IU] | Freq: Once | INTRAMUSCULAR | Status: AC
  Administered 2021-02-10: 40000 [IU] via SUBCUTANEOUS
  Filled 2021-02-10: qty 1

## 2021-02-10 MED ORDER — PANTOPRAZOLE SODIUM 40 MG IV SOLR: 40.0000 mg | INTRAVENOUS | 1 refills | Status: AC

## 2021-02-10 MED ORDER — METRONIDAZOLE 500 MG PO TABS: 500.0000 mg | ORAL_TABLET | Freq: Two times a day (BID) | ORAL | 0 refills | Status: AC

## 2021-02-10 MED ORDER — CYANOCOBALAMIN 1000 MCG/ML IJ SOLN
1000.0000 ug | Freq: Once | INTRAMUSCULAR | Status: AC
  Administered 2021-02-10: 1000 ug via INTRAMUSCULAR
  Filled 2021-02-10: qty 1

## 2021-02-10 MED ORDER — EPOETIN ALFA 40000 UNIT/ML IJ SOLN: 40000.0000 [IU] | Freq: Once | INTRAMUSCULAR | Status: DC

## 2021-02-10 NOTE — Progress Notes (Signed)
Long conversation had with patient about blood and blood product refusal  Initial conversations had with spouse in room of and later conversation in private with patient and nurse.  Patient admitted for pelvic pain and tubo-ovarian abscess.  Slow decline in hemoglobin likely due to active infection with no evidence of surgical bleeding.  Patient will likely need surgery in the future.  History of chronic anemia and menorrhagia only just recently being evaluated.  Patient husband are considering to work with [facility] consult and liaison.  They currently state they would not accept any whole blood, packed red cells or platelets but are currently okay with albumin.  They are asking for Epogen and vitamin supplementation.  Prior to conversation had with patient and we discussed the possibilities of worsening infection causing sepsis and acute need for blood transfusion and or future need for surgery with risks of active bleeding and surgery requiring blood transfusion.  I discussed with patient at times there are clinical situations in which we feel blood transfusion would be the only thing to keep her alive despite trying other alternatives first.  Patient states in these instances she still refuses blood products.  Patient believes this is her choice and states that she nor her family would be upset with the medical team for withholding blood products and in fact that is her wish.  Refusal of blood forms left with nurse to fill out with patient.  Ala Dach 02/10/2021 12:51 PM

## 2021-02-10 NOTE — Progress Notes (Signed)
Hospital day #3 Pelvic pain, tubo-ovarian abscess  Subjective: Patient notes able to sleep last night with the help of medications.  Still with significant abdominal pelvic pain.  Patient notes more bloating.  Patient notes no bowel movement since prior to admission.  Tolerating regular p.o. without nausea.  Patient ambulating without difficulty.  Patient with normal voids.  Patient is tolerating her IV Dilaudid and oral Percocet.  Patient denies vaginal bleeding.  Objective:  Vitals:   02/10/21 0439 02/10/21 0530  BP: 116/63 103/63  Pulse: 62 89  Resp: 17 17  Temp: 98.3 F (36.8 C) 98.2 F (36.8 C)  SpO2: 100% 94%   Physical exam General: Well-appearing, no acute distress, moves slowly and holds her abdomen Cardiovascular: Regular rate and rhythm Pulmonary: Clear to auscultation bilaterally Back: No costovertebral angle tenderness Abdomen: Tender throughout, nonsurgical abdomen, no rebound, no guarding but tenderness to deep palpation in all quadrants.  No particular distention GU: No vaginal blood.  No bladder tenderness, no cervical motion tenderness today but uterine tenderness present posteriorly more than anteriorly.  No pelvic masses palpated but exam difficult by habitus.  Tenderness in bilateral adnexa Lower extremity: Nontender, no edema  CBC Latest Ref Rng & Units 02/09/2021 02/09/2021 02/08/2021  WBC 4.0 - 10.5 K/uL 13.6(H) 11.5(H) 13.3(H)  Hemoglobin 12.0 - 15.0 g/dL 7.9(L) 8.2(L) 9.3(L)  Hematocrit 36.0 - 46.0 % 26.2(L) 27.4(L) 31.0(L)  Platelets 150 - 400 K/uL 345 378 342    Repeat ultrasound pending  Assessment plan: Hospital day #3 with tubo-ovarian abscess and continued pelvic pain.  Patient is clinically improving though still has pain.  We will repeat the ultrasound today to evaluate for worsening of abscess and to evaluate for abdominal free fluid.  Patient husband concerned with dropping hemoglobin and intraabdominal bleeding.  No evidence clinically of active  bleeding.  Discussed with patient would want to avoid urgent surgical intervention in the setting of active infection.  If abscess is worsening we can consult radiology for possible IR drainage.  As patient remains clinically stable as long as no new concerns on ultrasound we will plan to discharge home with 14-day course of antibiotics.  We will follow-up office in 1 week.  Eventual plan would be surgical removal of bilateral fallopian tubes due to history of hydrosalpinx and now likely active pyosalpinx. -History of menorrhagia with fibroids.  Discussed with patient risk benefits of hysteroscopic myomectomy versus hysterectomy.  Given her anemia and refusal of blood products would recommend hysteroscopic myomectomy though there are advantages of definitive hysterectomy.  This would depend on complication of surgery and preop hemoglobin.  Given the fibroid will start on progestin only hormonal suppression to help decrease blood loss with next menses.  Other hormonal medications for fibroids need to be assessed carefully with her history of chronic migraine -Anemia.  J[employer]ov[employer] with refusal of blood products.  Slow decline in hemoglobin now at 7.9.  Patient is already received IV iron.  We will add back folic acid, vitamin X72 and 1 dose of Epogen.  Pharmacy consult completed today to help with Epogen dosing.  Please see prior note for documentation of refusal of blood products. Dispo.  Hopefully home later today  Ala Dach 02/10/2021 12:58 PM

## 2021-02-13 ENCOUNTER — Other Ambulatory Visit: Payer: Self-pay | Admitting: Obstetrics and Gynecology

## 2021-02-14 ENCOUNTER — Other Ambulatory Visit: Payer: Self-pay | Admitting: Obstetrics and Gynecology

## 2021-02-15 ENCOUNTER — Non-Acute Institutional Stay (HOSPITAL_COMMUNITY)
Admission: RE | Admit: 2021-02-15 | Discharge: 2021-02-15 | Disposition: A | Discharge status: Home / Self Care | Payer: 59 | Source: Ambulatory Visit | Attending: Internal Medicine | Admitting: Internal Medicine

## 2021-02-15 ENCOUNTER — Other Ambulatory Visit: Payer: Self-pay

## 2021-02-15 DIAGNOSIS — D649 Anemia, unspecified: Secondary | ICD-10-CM | POA: Insufficient documentation

## 2021-02-15 MED ORDER — SODIUM CHLORIDE 0.9 % IV SOLN
INTRAVENOUS | Status: DC | PRN
  Administered 2021-02-15: 250 mL via INTRAVENOUS

## 2021-02-15 MED ORDER — SODIUM CHLORIDE 0.9 % IV SOLN
510.0000 mg | Freq: Once | INTRAVENOUS | Status: AC
  Administered 2021-02-15: 510 mg via INTRAVENOUS
  Filled 2021-02-15: qty 510

## 2021-02-15 NOTE — Progress Notes (Signed)
PATIENT CARE CENTER NOTE  Diagnosis: Anemia    Provider: Langley Gauss, DO   Procedure: Feraheme infusion    Note: Patient received Feraheme via PIV. Tolerated well with no adverse reaction. Observed patient for 30 minutes post-infusion. Vital signs stable. Discharge instructions given. Patient alert, oriented and ambulatory at discharge.

## 2021-02-15 NOTE — Discharge Instructions (Signed)
Ferumoxytol injection What is this medicine? FERUMOXYTOL is an iron complex. Iron is used to make healthy red blood cells, which carry oxygen and nutrients throughout the body. This medicine is used to treat iron deficiency anemia. This medicine may be used for other purposes; ask your health care provider or pharmacist if you have questions. COMMON BRAND NAME(S): Feraheme What should I tell my health care provider before I take this medicine? They need to know if you have any of these conditions:  anemia not caused by low iron levels  high levels of iron in the blood  magnetic resonance imaging (MRI) test scheduled  an unusual or allergic reaction to iron, other medicines, foods, dyes, or preservatives  pregnant or trying to get pregnant  breast-feeding How should I use this medicine? This medicine is for injection into a vein. It is given by a health care professional in a hospital or clinic setting. Talk to your pediatrician regarding the use of this medicine in children. Special care may be needed. Overdosage: If you think you have taken too much of this medicine contact a poison control center or emergency room at once. NOTE: This medicine is only for you. Do not share this medicine with others. What if I miss a dose? It is important not to miss your dose. Call your doctor or health care professional if you are unable to keep an appointment. What may interact with this medicine? This medicine may interact with the following medications:  other iron products This list may not describe all possible interactions. Give your health care provider a list of all the medicines, herbs, non-prescription drugs, or dietary supplements you use. Also tell them if you smoke, drink alcohol, or use illegal drugs. Some items may interact with your medicine. What should I watch for while using this medicine? Visit your doctor or healthcare professional regularly. Tell your doctor or healthcare  professional if your symptoms do not start to get better or if they get worse. You may need blood work done while you are taking this medicine. You may need to follow a special diet. Talk to your doctor. Foods that contain iron include: whole grains/cereals, dried fruits, beans, or peas, leafy green vegetables, and organ meats (liver, kidney). What side effects may I notice from receiving this medicine? Side effects that you should report to your doctor or health care professional as soon as possible:  allergic reactions like skin rash, itching or hives, swelling of the face, lips, or tongue  breathing problems  changes in blood pressure  feeling faint or lightheaded, falls  fever or chills  flushing, sweating, or hot feelings  swelling of the ankles or feet Side effects that usually do not require medical attention (report to your doctor or health care professional if they continue or are bothersome):  diarrhea  headache  nausea, vomiting  stomach pain This list may not describe all possible side effects. Call your doctor for medical advice about side effects. You may report side effects to FDA at 1-800-FDA-1088. Where should I keep my medicine? This drug is given in a hospital or clinic and will not be stored at home. NOTE: This sheet is a summary. It may not cover all possible information. If you have questions about this medicine, talk to your doctor, pharmacist, or health care provider.  2021 Elsevier/Gold Standard (2016-12-15 20:21:10)  

## 2021-02-17 NOTE — Discharge Summary (Signed)
Alexis Bernard MRN: [id number] DOB/AGE: September 27, 1976 45 y.o.  Admit date: 02/08/2021 Discharge date: 02/10/21  Admission Diagnoses: Hydrosalpinx [N70.11] Pelvic pain [R10.2]  Discharge Diagnoses: Hydrosalpinx [N70.11] Pelvic pain [R10.2]        Active Problems:   Pelvic pain PID, pyosalpynx, fibroid  Discharged Condition: stable  Hospital Course:  Pt admitted with pelvic pain and cervical motion tenderness needing pain control. It was unclear if pain was due to infection versus expulsing a known cavitary fibroid. Slight elevated WBC and WBC on wet prep as well as recent uterine instrumentation in the setting of prior hydrosalpynx were consistent with PID/ pyosalpynx and pt was started on cefotetan/ doxy. She was kept NPO overnight. On HD # 2 metronidazole was added as pt had a prior h/o BV.Ultrasound and CT scans were reviewed and discussed with the radiologist. It did not appear the fibroid was moving into the lower uterine segment and PID became the working diagnosis. Regular diet was ordered. Pt was given IV then subsequently po pain meds. Her pain control improved over her stay but she was still dependent on oral narcotics at the time of discharge. Tests for GC/CT were negative. Pt had slow decline in her Hgb from 9.8 to 7.9 There was no concern for intraabdominal bleeding but repeat u/s was done on HD #3 to ensure no new free fluid in pelvis. 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 Long discussion was had with pt and husband and then again with just the pt, all witnessed by nurse. Pt continued to decline blood and blood products but would accept albumin if needed. Given her refusal of blood products, IV iron was given on HD #2 and 1  shot of Procrit given on HD#3. Pt was discharged home with aygestin to help prevent ovulation/ menstrual bleeding in order to prevent further blood loss. Pt was not  a candidate for estrogen containing medications due to her migraines.   Consults: None  Treatments: IV hydration and antibiotics: cefotetan, doxycycline, metronidazole  Disposition: Discharge disposition: 01-Home or Self Care        Allergies as of 02/10/2021      Reactions   Latex Itching, Rash      Medication List    STOP taking these medications   aspirin-acetaminophen-caffeine 250-250-65 MG tablet Commonly known as: EXCEDRIN MIGRAINE   HYDROcodone-acetaminophen 7.5-325 MG tablet Commonly known as: NORCO   PAMPRIN MAX PO   PRENATAL GUMMIES/DHA & FA PO   SUMAtriptan 50 MG tablet Commonly known as: IMITREX     TAKE these medications   albuterol 108 (90 Base) MCG/ACT inhaler Commonly known as: VENTOLIN HFA Inhale 2 puffs into the lungs every 6 (six) hours as needed for wheezing or shortness of breath.   doxycycline 100 MG tablet Commonly known as: VIBRA-TABS Take 1 tablet (100 mg total) by mouth 2 (two) times daily.   ergocalciferol 1.25 MG (50000 UT) capsule Commonly known as: VITAMIN D2 Take 50,000 Units by mouth once a week. Mondays   metroNIDAZOLE 500 MG tablet Commonly known as: FLAGYL Take 1 tablet (500 mg total) by mouth 2 (two) times daily.   norethindrone 5 MG tablet Commonly known as: Aygestin Take 1 tablet (5 mg total) by mouth in the morning and at bedtime.   Nurtec 75 MG Tbdp Generic drug: Rimegepant Sulfate Take 1 tablet by mouth daily as needed (For migraine).   ondansetron 4 MG  tablet Commonly known as: ZOFRAN Take 1 tablet (4 mg total) by mouth every 6 (six) hours as needed for nausea.   oxyCODONE-acetaminophen 5-325 MG tablet Commonly known as: PERCOCET/ROXICET Take 1-2 tablets by mouth every 3 (three) hours as needed (moderate to severe pain (when tolerating fluids)).   pantoprazole 40 MG injection Commonly known as: PROTONIX Inject 40 mg into the vein daily.        Signed: Ala Dach, MD 02/17/2021, 9:30 PM

## 2021-09-06 DIAGNOSIS — Z20822 Contact with and (suspected) exposure to covid-19: Secondary | ICD-10-CM | POA: Diagnosis not present

## 2021-09-06 DIAGNOSIS — G43809 Other migraine, not intractable, without status migrainosus: Secondary | ICD-10-CM | POA: Diagnosis not present

## 2021-09-06 DIAGNOSIS — J209 Acute bronchitis, unspecified: Secondary | ICD-10-CM | POA: Diagnosis not present

## 2021-09-06 DIAGNOSIS — J019 Acute sinusitis, unspecified: Secondary | ICD-10-CM | POA: Diagnosis not present

## 2021-09-11 DIAGNOSIS — R102 Pelvic and perineal pain: Secondary | ICD-10-CM | POA: Diagnosis not present

## 2021-09-11 DIAGNOSIS — R350 Frequency of micturition: Secondary | ICD-10-CM | POA: Diagnosis not present

## 2021-09-11 DIAGNOSIS — R1032 Left lower quadrant pain: Secondary | ICD-10-CM | POA: Diagnosis not present

## 2021-09-12 ENCOUNTER — Encounter (HOSPITAL_BASED_OUTPATIENT_CLINIC_OR_DEPARTMENT_OTHER): Payer: Self-pay

## 2021-09-12 ENCOUNTER — Other Ambulatory Visit: Payer: Self-pay

## 2021-09-12 ENCOUNTER — Emergency Department (HOSPITAL_BASED_OUTPATIENT_CLINIC_OR_DEPARTMENT_OTHER): Payer: BC Managed Care – PPO

## 2021-09-12 ENCOUNTER — Emergency Department (HOSPITAL_BASED_OUTPATIENT_CLINIC_OR_DEPARTMENT_OTHER)
Admission: EM | Admit: 2021-09-12 | Discharge: 2021-09-12 | Disposition: A | Discharge status: Home / Self Care | Payer: BC Managed Care – PPO | Attending: Emergency Medicine | Admitting: Emergency Medicine

## 2021-09-12 DIAGNOSIS — J45909 Unspecified asthma, uncomplicated: Secondary | ICD-10-CM | POA: Diagnosis not present

## 2021-09-12 DIAGNOSIS — R1032 Left lower quadrant pain: Secondary | ICD-10-CM | POA: Insufficient documentation

## 2021-09-12 DIAGNOSIS — K7689 Other specified diseases of liver: Secondary | ICD-10-CM | POA: Diagnosis not present

## 2021-09-12 DIAGNOSIS — Z91040 Latex allergy status: Secondary | ICD-10-CM | POA: Insufficient documentation

## 2021-09-12 DIAGNOSIS — M5442 Lumbago with sciatica, left side: Secondary | ICD-10-CM | POA: Insufficient documentation

## 2021-09-12 DIAGNOSIS — M5432 Sciatica, left side: Secondary | ICD-10-CM | POA: Diagnosis not present

## 2021-09-12 DIAGNOSIS — L02211 Cutaneous abscess of abdominal wall: Secondary | ICD-10-CM | POA: Diagnosis not present

## 2021-09-12 DIAGNOSIS — Z90710 Acquired absence of both cervix and uterus: Secondary | ICD-10-CM | POA: Diagnosis not present

## 2021-09-12 LAB — URINALYSIS, ROUTINE W REFLEX MICROSCOPIC
Bilirubin Urine: NEGATIVE
Glucose, UA: NEGATIVE mg/dL
Ketones, ur: NEGATIVE mg/dL
Leukocytes,Ua: NEGATIVE
Nitrite: NEGATIVE
Protein, ur: NEGATIVE mg/dL
Specific Gravity, Urine: >1.03 — ABNORMAL HIGH (ref 1.005–1.030)
pH: 5.5 (ref 5.0–8.0)

## 2021-09-12 LAB — CBC
HCT: 44.8 % (ref 36.0–46.0)
Hemoglobin: 14.7 g/dL (ref 12.0–15.0)
MCH: 30.4 pg (ref 26.0–34.0)
MCHC: 32.8 g/dL (ref 30.0–36.0)
MCV: 92.8 fL (ref 80.0–100.0)
Platelets: 316 10*3/uL (ref 150–400)
RBC: 4.83 MIL/uL (ref 3.87–5.11)
RDW: 12.3 % (ref 11.5–15.5)
WBC: 8.5 10*3/uL (ref 4.0–10.5)
nRBC: 0 % (ref 0.0–0.2)

## 2021-09-12 LAB — URINALYSIS, MICROSCOPIC (REFLEX): WBC, UA: NONE SEEN WBC/hpf (ref 0–5)

## 2021-09-12 LAB — COMPREHENSIVE METABOLIC PANEL
ALT: 19 U/L (ref 0–44)
AST: 19 U/L (ref 15–41)
Albumin: 4.2 g/dL (ref 3.5–5.0)
Alkaline Phosphatase: 62 U/L (ref 38–126)
Anion gap: 8 (ref 5–15)
BUN: 11 mg/dL (ref 6–20)
CO2: 28 mmol/L (ref 22–32)
Calcium: 9.7 mg/dL (ref 8.9–10.3)
Chloride: 103 mmol/L (ref 98–111)
Creatinine, Ser: 0.74 mg/dL (ref 0.44–1.00)
GFR, Estimated: >60 mL/min (ref >60)
Glucose, Bld: 88 mg/dL (ref 70–99)
Potassium: 4 mmol/L (ref 3.5–5.1)
Sodium: 139 mmol/L (ref 135–145)
Total Bilirubin: 0.6 mg/dL (ref 0.3–1.2)
Total Protein: 8.2 g/dL — ABNORMAL HIGH (ref 6.5–8.1)

## 2021-09-12 LAB — LIPASE, BLOOD: Lipase: 30 U/L (ref 11–51)

## 2021-09-12 MED ORDER — METHOCARBAMOL 500 MG PO TABS: 500.0000 mg | ORAL_TABLET | Freq: Four times a day (QID) | ORAL | 0 refills | Status: DC | PRN

## 2021-09-12 MED ORDER — HYDROMORPHONE HCL 1 MG/ML IJ SOLN
1.0000 mg | Freq: Once | INTRAMUSCULAR | Status: AC
Start: 2021-09-12 — End: 2021-09-12
  Administered 2021-09-12: 1 mg via INTRAVENOUS
  Filled 2021-09-12: qty 1

## 2021-09-12 MED ORDER — HYDROCODONE-ACETAMINOPHEN 5-325 MG PO TABS
1.0000 | ORAL_TABLET | Freq: Once | ORAL | Status: AC
  Administered 2021-09-12: 1 via ORAL
  Filled 2021-09-12: qty 1

## 2021-09-12 MED ORDER — SODIUM CHLORIDE 0.9 % IV BOLUS
1000.0000 mL | Freq: Once | INTRAVENOUS | Status: AC
  Administered 2021-09-12: 1000 mL via INTRAVENOUS

## 2021-09-12 MED ORDER — IOHEXOL 300 MG/ML  SOLN
100.0000 mL | Freq: Once | INTRAMUSCULAR | Status: AC | PRN
  Administered 2021-09-12: 100 mL via INTRAVENOUS

## 2021-09-12 MED ORDER — HYDROCODONE-ACETAMINOPHEN 5-325 MG PO TABS: 1.0000 | ORAL_TABLET | Freq: Four times a day (QID) | ORAL | 0 refills | Status: AC | PRN

## 2021-09-12 MED ORDER — DEXAMETHASONE SODIUM PHOSPHATE 10 MG/ML IJ SOLN
10.0000 mg | Freq: Once | INTRAMUSCULAR | Status: AC
  Administered 2021-09-12: 10 mg via INTRAVENOUS
  Filled 2021-09-12: qty 1

## 2021-09-12 NOTE — ED Triage Notes (Signed)
Pt c/o left side abd/left flank pain, diarrhea x 1 week-states she was seen by GYN yesterday-had [place] and advised she had ovarian cyst-pt states she was given rx tramadol but it made her nauseous-NAD-to triage to w/c

## 2021-09-12 NOTE — ED Provider Notes (Signed)
[facility] Provider Note   CSN: [id number] Arrival date & time: 09/12/21  1605     History Chief Complaint  Patient presents with   Abdominal Pain    Alexis Bernard is a 45 y.o. female.  HPI Patient reports he started having lower abdominal pain about a week ago.  Pain is in the left lower abdomen.  At first she thought maybe it was a ovarian cyst.  Patient does have history of a uterine hysterectomy.  Pain however persisted and worsened in her back and lower abdomen.  Is worse with movements.  Nothing has improved her pain.  She was given tramadol but it made her nauseated.  She reports the pain is in her left low back and radiates around to the low abdomen.  She also reports pain radiates into her leg.  No weakness or numbness of the leg.  She reports several days ago she did see some blood in the urine but that resolved.  She has not experienced any pain burning or urgency with urination.  She reports she has had diarrhea for several days.  No blood in the stool.  No vomiting.  No fever.  No history of similar pain.    Past Medical History:  Diagnosis Date   Arthritis    Asthma    Complication of anesthesia    Dysrhythmia    as a child   Headache    migraines   Hypoglycemia    Murmur    pt denies currently, as a child   PONV (postoperative nausea and vomiting)    Sjogren's disease ([place])    Spinal stenosis     Patient Active Problem List   Diagnosis Date Noted   Pelvic pain 02/08/2021   Cervical vertebral fusion 10/29/2017    Past Surgical History:  Procedure Laterality Date   ABDOMINAL HYSTERECTOMY     ANTERIOR CERVICAL DECOMP/DISCECTOMY FUSION N/A 10/29/2017   Procedure: Anterior Cervical Decompression Fusion - Cervical four-Cervical five - Cervical five-Cervical six;  Surgeon: Eustace Moore, MD;  Location: [facility];  Service: Neurosurgery;  Laterality: N/A;   FRACTURE SURGERY     anklex4   laproscopic surgery to evaluate  endometriosis       OB History   No obstetric history on file.     No family history on file.  Social History   Tobacco Use   Smoking status: Never   Smokeless tobacco: Never  Vaping Use   Vaping Use: Never used  Substance Use Topics   Alcohol use: Not Currently   Drug use: No    Home Medications Prior to Admission medications   Medication Sig Start Date End Date Taking? Authorizing Provider  HYDROcodone-acetaminophen (NORCO/VICODIN) 5-325 MG tablet Take 1-2 tablets by mouth every 6 (six) hours as needed for moderate pain or severe pain. 09/12/21  Yes Charlesetta Shanks, MD  methocarbamol (ROBAXIN) 500 MG tablet Take 1 tablet (500 mg total) by mouth every 6 (six) hours as needed for muscle spasms. 09/12/21  Yes Charlesetta Shanks, MD  albuterol (PROVENTIL HFA;VENTOLIN HFA) 108 (90 Base) MCG/ACT inhaler Inhale 2 puffs into the lungs every 6 (six) hours as needed for wheezing or shortness of breath.    [provider]  doxycycline (VIBRA-TABS) 100 MG tablet Take 1 tablet (100 mg total) by mouth 2 (two) times daily. 02/10/21   Aloha Gell, MD  ergocalciferol (VITAMIN D2) 50000 units capsule Take 50,000 Units by mouth once a week. Mondays  [provider]  metroNIDAZOLE (FLAGYL) 500 MG tablet Take 1 tablet (500 mg total) by mouth 2 (two) times daily. 02/10/21   Aloha Gell, MD  norethindrone (AYGESTIN) 5 MG tablet Take 1 tablet (5 mg total) by mouth in the morning and at bedtime. 02/10/21   Aloha Gell, MD  NURTEC 75 MG TBDP Take 1 tablet by mouth daily as needed (For migraine). 09/22/20   [provider]  ondansetron (ZOFRAN) 4 MG tablet Take 1 tablet (4 mg total) by mouth every 6 (six) hours as needed for nausea. 02/10/21   Aloha Gell, MD  oxyCODONE-acetaminophen (PERCOCET/ROXICET) 5-325 MG tablet Take 1-2 tablets by mouth every 3 (three) hours as needed (moderate to severe pain (when tolerating fluids)). 02/10/21   Aloha Gell, MD  pantoprazole  (PROTONIX) 40 MG injection Inject 40 mg into the vein daily. 02/11/21   Aloha Gell, MD    Allergies    Latex  Review of Systems   Review of Systems 10 systems reviewed and negative except as per HPI Physical Exam Updated Vital Signs BP 123/82   Pulse 83   Temp 98.3 F (36.8 C) (Oral)   Resp 18   Ht 5\' 2"  (1.575 m)   Wt 76.7 kg   LMP 08/11/2019   SpO2 100%   BMI 30.91 kg/m   Physical Exam Constitutional:      Comments: Alert nontoxic no acute distress.  Patient appears uncomfortable.  HENT:     Head: Normocephalic and atraumatic.     Mouth/Throat:     Pharynx: Oropharynx is clear.  Eyes:     Extraocular Movements: Extraocular movements intact.  Cardiovascular:     Rate and Rhythm: Normal rate and regular rhythm.  Pulmonary:     Effort: Pulmonary effort is normal.     Breath sounds: Normal breath sounds.  Abdominal:     Comments: Abdomen soft.  Moderate to severe left lower quadrant pain to palpation.  Musculoskeletal:        General: No swelling or tenderness. Normal range of motion.     Right lower leg: No edema.     Left lower leg: No edema.  Skin:    General: Skin is warm and dry.  Neurological:     General: No focal deficit present.     Mental Status: She is oriented to person, place, and time.     Coordination: Coordination normal.  Psychiatric:        Mood and Affect: Mood normal.    ED Results / Procedures / Treatments   Labs (all labs ordered are listed, but only abnormal results are displayed) Labs Reviewed  COMPREHENSIVE METABOLIC PANEL - Abnormal; Notable for the following components:      Result Value   Total Protein 8.2 (*)    All other components within normal limits  URINALYSIS, ROUTINE W REFLEX MICROSCOPIC - Abnormal; Notable for the following components:   Specific Gravity, Urine >1.030 (*)    Hgb urine dipstick SMALL (*)    All other components within normal limits  URINALYSIS, MICROSCOPIC (REFLEX) - Abnormal; Notable for the  following components:   Bacteria, UA RARE (*)    All other components within normal limits  LIPASE, BLOOD  CBC    EKG None  Radiology CT Abdomen Pelvis W Contrast  Result Date: 09/12/2021 CLINICAL DATA:  Abdominal abscess/infection suspected EXAM: CT ABDOMEN AND PELVIS WITH CONTRAST TECHNIQUE: Multidetector CT imaging of the abdomen and pelvis was performed using the standard protocol following bolus administration  of intravenous contrast. CONTRAST:  130mL OMNIPAQUE IOHEXOL 300 MG/ML  SOLN COMPARISON:  02/08/2021 FINDINGS: Lower chest: Lung bases are clear. No effusions. Heart is normal size. Hepatobiliary: Scattered small hypodensities in the liver, likely small cysts. Gallbladder unremarkable. Pancreas: No focal abnormality or ductal dilatation. Spleen: No focal abnormality.  Normal size. Adrenals/Urinary Tract: No adrenal abnormality. No focal renal abnormality. No stones or hydronephrosis. Urinary bladder is unremarkable. Stomach/Bowel: Normal appendix. Stomach, large and small bowel grossly unremarkable. Vascular/Lymphatic: No evidence of aneurysm or adenopathy. Reproductive: Prior hysterectomy.  No adnexal masses. Other: No free fluid or free air. Musculoskeletal: No acute bony abnormality. IMPRESSION: No acute findings in the abdomen or pelvis. Electronically Signed   By: Rolm Baptise M.D.   On: 09/12/2021 21:24    Procedures Procedures   Medications Ordered in ED Medications  sodium chloride 0.9 % bolus 1,000 mL (0 mLs Intravenous Stopped 09/12/21 2219)  HYDROmorphone (DILAUDID) injection 1 mg (1 mg Intravenous Given 09/12/21 2118)  iohexol (OMNIPAQUE) 300 MG/ML solution 100 mL (100 mLs Intravenous Contrast Given 09/12/21 2059)  dexamethasone (DECADRON) injection 10 mg (10 mg Intravenous Given 09/12/21 2143)  HYDROcodone-acetaminophen (NORCO/VICODIN) 5-325 MG per tablet 1 tablet (1 tablet Oral Given 09/12/21 2145)    ED Course  I have reviewed the triage vital signs and the nursing  notes.  Pertinent labs & imaging results that were available during my care of the patient were reviewed by me and considered in my medical decision making (see chart for details).    MDM Rules/Calculators/A&P                           Has had pain for about a week.  She indicates a significant mount of left lower abdominal pain.  On exam she is tender in the left lower abdomen.  She also however has lower back pain and endorses radiation into her leg.  Patient has prior history of cervical disc disease.  CT scan does not show any intra-abdominal process.  Patient has had a hysterectomy.  She does have remaining ovaries but at this time I do not think pain is consistent with ovarian source.  With pain including radiation to the leg now we will treat for sciatica.  Patient is neurovascularly intact.  She does not have any weakness or numbness.  Patient is recommended follow-up with her PCP and her neurosurgeon ASAP.  1 dose of Decadron given in the emergency department.  Patient provided Robaxin and Vicodin for pain control. Final Clinical Impression(s) / ED Diagnoses Final diagnoses:  Sciatica of left side  Left lower quadrant abdominal pain    Rx / DC Orders ED Discharge Orders          Ordered    HYDROcodone-acetaminophen (NORCO/VICODIN) 5-325 MG tablet  Every 6 hours PRN        09/12/21 2239    methocarbamol (ROBAXIN) 500 MG tablet  Every 6 hours PRN        09/12/21 2239             Charlesetta Shanks, MD 09/12/21 2245

## 2021-09-12 NOTE — Discharge Instructions (Signed)
At this time your CT scan does not show any abnormalities.  Your exam suggest sciatica.  You have pain that is going from your back around to your lower abdomen in your leg.  You have been given a dose of Decadron in the emergency department.  This is a steroid.  You have been given a prescription for Vicodin and Robaxin to take for pain control at home.  Call your neurosurgeon's office tomorrow to schedule a follow-up appointment as soon as possible.  At this time with a negative CT scan it does not appear that your abdominal pain is due to a problem in your abdomen.  However if you are getting worsening abdominal pain, fever, vomiting, urinary symptoms or problems with your bowel movements you must have a recheck as soon as possible.

## 2021-11-15 DIAGNOSIS — Z1159 Encounter for screening for other viral diseases: Secondary | ICD-10-CM | POA: Diagnosis not present

## 2021-11-15 DIAGNOSIS — J45909 Unspecified asthma, uncomplicated: Secondary | ICD-10-CM | POA: Diagnosis not present

## 2021-11-15 DIAGNOSIS — U071 COVID-19: Secondary | ICD-10-CM | POA: Diagnosis not present

## 2021-11-15 DIAGNOSIS — Z20822 Contact with and (suspected) exposure to covid-19: Secondary | ICD-10-CM | POA: Diagnosis not present

## 2021-11-15 DIAGNOSIS — E669 Obesity, unspecified: Secondary | ICD-10-CM | POA: Diagnosis not present

## 2021-11-15 DIAGNOSIS — Z6830 Body mass index (BMI) 30.0-30.9, adult: Secondary | ICD-10-CM | POA: Diagnosis not present

## 2021-12-27 DIAGNOSIS — Z6831 Body mass index (BMI) 31.0-31.9, adult: Secondary | ICD-10-CM | POA: Diagnosis not present

## 2021-12-27 DIAGNOSIS — G43909 Migraine, unspecified, not intractable, without status migrainosus: Secondary | ICD-10-CM | POA: Diagnosis not present

## 2021-12-27 DIAGNOSIS — G43119 Migraine with aura, intractable, without status migrainosus: Secondary | ICD-10-CM | POA: Diagnosis not present

## 2022-01-03 DIAGNOSIS — Z0000 Encounter for general adult medical examination without abnormal findings: Secondary | ICD-10-CM | POA: Diagnosis not present

## 2022-01-03 DIAGNOSIS — H6123 Impacted cerumen, bilateral: Secondary | ICD-10-CM | POA: Diagnosis not present

## 2022-01-03 DIAGNOSIS — Z131 Encounter for screening for diabetes mellitus: Secondary | ICD-10-CM | POA: Diagnosis not present

## 2022-01-03 DIAGNOSIS — R5383 Other fatigue: Secondary | ICD-10-CM | POA: Diagnosis not present

## 2022-01-03 DIAGNOSIS — E559 Vitamin D deficiency, unspecified: Secondary | ICD-10-CM | POA: Diagnosis not present

## 2022-01-03 DIAGNOSIS — Z20822 Contact with and (suspected) exposure to covid-19: Secondary | ICD-10-CM | POA: Diagnosis not present

## 2022-01-03 DIAGNOSIS — H6121 Impacted cerumen, right ear: Secondary | ICD-10-CM | POA: Diagnosis not present

## 2022-01-03 DIAGNOSIS — Z6831 Body mass index (BMI) 31.0-31.9, adult: Secondary | ICD-10-CM | POA: Diagnosis not present

## 2022-01-17 DIAGNOSIS — Z6831 Body mass index (BMI) 31.0-31.9, adult: Secondary | ICD-10-CM | POA: Diagnosis not present

## 2022-01-17 DIAGNOSIS — G43909 Migraine, unspecified, not intractable, without status migrainosus: Secondary | ICD-10-CM | POA: Diagnosis not present

## 2022-01-17 DIAGNOSIS — Z7185 Encounter for immunization safety counseling: Secondary | ICD-10-CM | POA: Diagnosis not present

## 2022-01-17 DIAGNOSIS — G43119 Migraine with aura, intractable, without status migrainosus: Secondary | ICD-10-CM | POA: Diagnosis not present

## 2022-01-17 DIAGNOSIS — K219 Gastro-esophageal reflux disease without esophagitis: Secondary | ICD-10-CM | POA: Diagnosis not present

## 2022-02-26 DIAGNOSIS — Z6831 Body mass index (BMI) 31.0-31.9, adult: Secondary | ICD-10-CM | POA: Diagnosis not present

## 2022-02-26 DIAGNOSIS — N182 Chronic kidney disease, stage 2 (mild): Secondary | ICD-10-CM | POA: Diagnosis not present

## 2022-02-26 DIAGNOSIS — K219 Gastro-esophageal reflux disease without esophagitis: Secondary | ICD-10-CM | POA: Diagnosis not present

## 2022-02-26 DIAGNOSIS — G43119 Migraine with aura, intractable, without status migrainosus: Secondary | ICD-10-CM | POA: Diagnosis not present

## 2022-02-26 DIAGNOSIS — G43909 Migraine, unspecified, not intractable, without status migrainosus: Secondary | ICD-10-CM | POA: Diagnosis not present

## 2022-04-25 DIAGNOSIS — G43909 Migraine, unspecified, not intractable, without status migrainosus: Secondary | ICD-10-CM | POA: Diagnosis not present

## 2022-04-25 DIAGNOSIS — Z6832 Body mass index (BMI) 32.0-32.9, adult: Secondary | ICD-10-CM | POA: Diagnosis not present

## 2022-04-25 DIAGNOSIS — R072 Precordial pain: Secondary | ICD-10-CM | POA: Diagnosis not present

## 2022-04-25 DIAGNOSIS — R519 Headache, unspecified: Secondary | ICD-10-CM | POA: Diagnosis not present

## 2022-06-27 ENCOUNTER — Emergency Department (HOSPITAL_BASED_OUTPATIENT_CLINIC_OR_DEPARTMENT_OTHER)
Admission: EM | Admit: 2022-06-27 | Discharge: 2022-06-27 | Disposition: A | Discharge status: Home / Self Care | Payer: BC Managed Care – PPO | Attending: Emergency Medicine | Admitting: Emergency Medicine

## 2022-06-27 ENCOUNTER — Other Ambulatory Visit: Payer: Self-pay

## 2022-06-27 ENCOUNTER — Emergency Department (HOSPITAL_BASED_OUTPATIENT_CLINIC_OR_DEPARTMENT_OTHER): Payer: BC Managed Care – PPO

## 2022-06-27 DIAGNOSIS — M5432 Sciatica, left side: Secondary | ICD-10-CM | POA: Diagnosis not present

## 2022-06-27 DIAGNOSIS — Z91040 Latex allergy status: Secondary | ICD-10-CM | POA: Diagnosis not present

## 2022-06-27 DIAGNOSIS — Z79899 Other long term (current) drug therapy: Secondary | ICD-10-CM | POA: Diagnosis not present

## 2022-06-27 DIAGNOSIS — M5442 Lumbago with sciatica, left side: Secondary | ICD-10-CM | POA: Diagnosis not present

## 2022-06-27 DIAGNOSIS — M79605 Pain in left leg: Secondary | ICD-10-CM | POA: Diagnosis not present

## 2022-06-27 MED ORDER — CYCLOBENZAPRINE HCL 10 MG PO TABS: 10.0000 mg | ORAL_TABLET | Freq: Two times a day (BID) | ORAL | 0 refills | Status: AC | PRN

## 2022-06-27 MED ORDER — KETOROLAC TROMETHAMINE 30 MG/ML IJ SOLN
30.0000 mg | Freq: Once | INTRAMUSCULAR | Status: AC
  Administered 2022-06-27: 30 mg via INTRAMUSCULAR
  Filled 2022-06-27: qty 1

## 2022-06-27 MED ORDER — NAPROXEN 500 MG PO TABS: 500.0000 mg | ORAL_TABLET | Freq: Two times a day (BID) | ORAL | 0 refills | Status: AC

## 2022-06-27 NOTE — ED Notes (Signed)
L leg pain and L hip pain  Pt. Reports having L ankle pain several times.  Pt. Reports she can limp on the L leg but this pain started yesterday.  Pt. Can lift the L leg and bend the L leg and hurts and does not lif the leg all the way at the hip on the L side.

## 2022-06-27 NOTE — ED Notes (Signed)
EDP at bedside of pt.

## 2022-06-27 NOTE — Discharge Instructions (Addendum)
Your DVT study returned normal today. I suspect that you have Sciatica which is irritation of your sciatic nerve that runs down your leg. I recommend starting on Naproxen twice a day for about one week. I have also prescribed you Flexeril which is a muscle relaxer that may help. This medication may make you drowsy so please do not take prior to driving or operating any heavy machinery.  You can schedule a follow up visit with Orthopedics or schedule an appointment with your PCP. Please call the number provided.

## 2022-06-27 NOTE — ED Provider Notes (Signed)
[facility] EMERGENCY DEPARTMENT Provider Note   CSN: [id number] Arrival date & time: 06/27/22  1242     History PMH: Spinal Stenosis Chief Complaint  Patient presents with   Leg Pain    Alexis Bernard is a 46 y.o. female. Presents to the emergency department with chief complaint of left leg pain.  She says over the past couple of days she has had worsening left thigh pain.  She describes it as a sensation similar to a tooth ache.  She says it radiates down her leg all the way to her feet.  She says yesterday she started noticing worsening hip pain on the left with that.  She also feels that her left leg might be more swollen than normal.  She has no associated skin changes.  She has worsening pain with range of motion.  She denies any recent injury, lower back pain, saddle anesthesia, bowel or bladder dysfunction, fevers, chills.  No history of IV drug use. She has no history of DVT, recent travel, or recent surgery.   Leg Pain Associated symptoms: no back pain, no fever and no neck pain        Home Medications Prior to Admission medications   Medication Sig Start Date End Date Taking? Authorizing Provider  cyclobenzaprine (FLEXERIL) 10 MG tablet Take 1 tablet (10 mg total) by mouth 2 (two) times daily as needed for up to 7 days for muscle spasms. 06/27/22 07/04/22 Yes Dennise Raabe, Adora Fridge, PA-C  naproxen (NAPROSYN) 500 MG tablet Take 1 tablet (500 mg total) by mouth 2 (two) times daily for 7 days. 06/27/22 07/04/22 Yes Davonne Jarnigan, Adora Fridge, PA-C  albuterol (PROVENTIL HFA;VENTOLIN HFA) 108 (90 Base) MCG/ACT inhaler Inhale 2 puffs into the lungs every 6 (six) hours as needed for wheezing or shortness of breath.    [provider]  doxycycline (VIBRA-TABS) 100 MG tablet Take 1 tablet (100 mg total) by mouth 2 (two) times daily. 02/10/21   Aloha Gell, MD  ergocalciferol (VITAMIN D2) 50000 units capsule Take 50,000 Units by mouth once a week. Mondays    [provider]  HYDROcodone-acetaminophen (NORCO/VICODIN) 5-325 MG tablet Take 1-2 tablets by mouth every 6 (six) hours as needed for moderate pain or severe pain. 09/12/21   Charlesetta Shanks, MD  methocarbamol (ROBAXIN) 500 MG tablet Take 1 tablet (500 mg total) by mouth every 6 (six) hours as needed for muscle spasms. 09/12/21   Charlesetta Shanks, MD  metroNIDAZOLE (FLAGYL) 500 MG tablet Take 1 tablet (500 mg total) by mouth 2 (two) times daily. 02/10/21   Aloha Gell, MD  norethindrone (AYGESTIN) 5 MG tablet Take 1 tablet (5 mg total) by mouth in the morning and at bedtime. 02/10/21   Aloha Gell, MD  NURTEC 75 MG TBDP Take 1 tablet by mouth daily as needed (For migraine). 09/22/20   [provider]  ondansetron (ZOFRAN) 4 MG tablet Take 1 tablet (4 mg total) by mouth every 6 (six) hours as needed for nausea. 02/10/21   Aloha Gell, MD  oxyCODONE-acetaminophen (PERCOCET/ROXICET) 5-325 MG tablet Take 1-2 tablets by mouth every 3 (three) hours as needed (moderate to severe pain (when tolerating fluids)). 02/10/21   Aloha Gell, MD  pantoprazole (PROTONIX) 40 MG injection Inject 40 mg into the vein daily. 02/11/21   Aloha Gell, MD      Allergies    Latex    Review of Systems   Review of Systems  Constitutional:  Negative for chills and fever.  Cardiovascular:  Positive for leg swelling.  Musculoskeletal:  Positive for arthralgias. Negative for back pain and neck pain.  Skin:  Negative for color change.  All other systems reviewed and are negative.   Physical Exam Updated Vital Signs BP 129/85   Pulse 69   Temp 97.8 F (36.6 C) (Oral)   Resp 17   LMP 08/11/2019   SpO2 100%  Physical Exam Vitals and nursing note reviewed.  Constitutional:      General: She is not in acute distress.    Appearance: Normal appearance. She is well-developed. She is not ill-appearing, toxic-appearing or diaphoretic.  HENT:     Head: Normocephalic and atraumatic.     Nose: No nasal  deformity.     Mouth/Throat:     Lips: Pink. No lesions.  Eyes:     General: Gaze aligned appropriately. No scleral icterus.       Right eye: No discharge.        Left eye: No discharge.     Conjunctiva/sclera: Conjunctivae normal.     Right eye: Right conjunctiva is not injected. No exudate or hemorrhage.    Left eye: Left conjunctiva is not injected. No exudate or hemorrhage. Pulmonary:     Effort: Pulmonary effort is normal. No respiratory distress.  Musculoskeletal:     Comments: Patient has reproducible pain along the left side of her hip, anterior and posterior thigh, posterior popliteal, and behind the left calf.  She has a positive straight leg test.  I cannot appreciate any noticeable swelling on exam.  There is no skin discoloration.  Pain seems worse with range of motion.  She has 2+ pedal pulse and normal sensation.  SHe has no L-spine TTP or step-offs.  Skin:    General: Skin is warm and dry.  Neurological:     Mental Status: She is alert and oriented to person, place, and time.  Psychiatric:        Mood and Affect: Mood normal.        Speech: Speech normal.        Behavior: Behavior normal. Behavior is cooperative.     ED Results / Procedures / Treatments   Labs (all labs ordered are listed, but only abnormal results are displayed) Labs Reviewed - No data to display  EKG None  Radiology US Venous Img Lower  Left (DVT Study)  Result Date: 06/27/2022 CLINICAL DATA:  Pain EXAM: Left LOWER EXTREMITY VENOUS DOPPLER ULTRASOUND TECHNIQUE: Gray-scale sonography with compression, as well as color and duplex ultrasound, were performed to evaluate the deep venous system(s) from the level of the common femoral vein through the popliteal and proximal calf veins. COMPARISON:  None Available. FINDINGS: VENOUS Normal compressibility of the common femoral, superficial femoral, and popliteal veins, as well as the visualized calf veins. Visualized portions of profunda femoral vein  and great saphenous vein unremarkable. No filling defects to suggest DVT on grayscale or color Doppler imaging. Doppler waveforms show normal direction of venous flow, normal respiratory plasticity and response to augmentation. Limited views of the contralateral common femoral vein are unremarkable. OTHER None. Limitations: none IMPRESSION: There is no evidence of deep venous thrombosis in left lower extremity. Electronically Signed   By: Elmer Picker M.D.   On: 06/27/2022 16:31    Procedures Procedures   Medications Ordered in ED Medications  ketorolac (TORADOL) 30 MG/ML injection 30 mg (30 mg Intramuscular Given 06/27/22 1545)    ED Course/ Medical Decision Making/ A&P  Medical Decision Making Risk Prescription drug management.   Patient presents with left hip pain is radiating down her left leg.  She appears to be in no acute distress other than being in pain, and has stable vitals.  Her history sounds very consistent with sciatica, however she feels she is having worsening leg swelling and would like to rule out a DVT.  I have placed this order and will reevaluate after giving Toradol.  DVT study is negative. Symptoms improvement after Toradol.  I suspect this is sciatica. No red flag lower back pain sx to suggest cauda equina, cord compression, epidural abscess/hematoma, or malignancy.  Recommend NSAIDs, conservative management. F/u with Dr. Lyla Glassing from Orthopedics provided.    Final Clinical Impression(s) / ED Diagnoses Final diagnoses:  Sciatica of left side    Rx / DC Orders ED Discharge Orders          Ordered    naproxen (NAPROSYN) 500 MG tablet  2 times daily        06/27/22 1640    cyclobenzaprine (FLEXERIL) 10 MG tablet  2 times daily PRN        06/27/22 1640              Wenda Vanschaick, Adora Fridge, PA-C 06/27/22 [place], [place], DO 06/27/22 1849

## 2022-06-27 NOTE — ED Triage Notes (Signed)
Patient presents to ED via POV from home. Here with left leg pain since yesterday. Denies injury or trauma.

## 2022-09-15 ENCOUNTER — Emergency Department (HOSPITAL_BASED_OUTPATIENT_CLINIC_OR_DEPARTMENT_OTHER): Payer: BC Managed Care – PPO

## 2022-09-15 ENCOUNTER — Encounter (HOSPITAL_BASED_OUTPATIENT_CLINIC_OR_DEPARTMENT_OTHER): Payer: Self-pay | Admitting: Emergency Medicine

## 2022-09-15 ENCOUNTER — Emergency Department (HOSPITAL_BASED_OUTPATIENT_CLINIC_OR_DEPARTMENT_OTHER)
Admission: EM | Admit: 2022-09-15 | Discharge: 2022-09-15 | Disposition: A | Discharge status: Home / Self Care | Payer: BC Managed Care – PPO | Attending: Emergency Medicine | Admitting: Emergency Medicine

## 2022-09-15 DIAGNOSIS — M79604 Pain in right leg: Secondary | ICD-10-CM | POA: Insufficient documentation

## 2022-09-15 DIAGNOSIS — R5383 Other fatigue: Secondary | ICD-10-CM | POA: Diagnosis not present

## 2022-09-15 DIAGNOSIS — R059 Cough, unspecified: Secondary | ICD-10-CM | POA: Diagnosis not present

## 2022-09-15 DIAGNOSIS — X58XXXA Exposure to other specified factors, initial encounter: Secondary | ICD-10-CM | POA: Insufficient documentation

## 2022-09-15 DIAGNOSIS — S8991XA Unspecified injury of right lower leg, initial encounter: Secondary | ICD-10-CM | POA: Diagnosis not present

## 2022-09-15 DIAGNOSIS — Z91040 Latex allergy status: Secondary | ICD-10-CM | POA: Insufficient documentation

## 2022-09-15 DIAGNOSIS — R531 Weakness: Secondary | ICD-10-CM | POA: Insufficient documentation

## 2022-09-15 DIAGNOSIS — Z91010 Allergy to peanuts: Secondary | ICD-10-CM | POA: Insufficient documentation

## 2022-09-15 DIAGNOSIS — S8011XA Contusion of right lower leg, initial encounter: Secondary | ICD-10-CM | POA: Insufficient documentation

## 2022-09-15 DIAGNOSIS — M7989 Other specified soft tissue disorders: Secondary | ICD-10-CM | POA: Diagnosis not present

## 2022-09-15 LAB — BASIC METABOLIC PANEL
Anion gap: 9 (ref 5–15)
BUN: 12 mg/dL (ref 6–20)
CO2: 25 mmol/L (ref 22–32)
Calcium: 9.1 mg/dL (ref 8.9–10.3)
Chloride: 104 mmol/L (ref 98–111)
Creatinine, Ser: 0.75 mg/dL (ref 0.44–1.00)
GFR, Estimated: >60 mL/min (ref >60)
Glucose, Bld: 89 mg/dL (ref 70–99)
Potassium: 3.5 mmol/L (ref 3.5–5.1)
Sodium: 138 mmol/L (ref 135–145)

## 2022-09-15 LAB — URINALYSIS, ROUTINE W REFLEX MICROSCOPIC
Bilirubin Urine: NEGATIVE
Glucose, UA: NEGATIVE mg/dL
Hgb urine dipstick: NEGATIVE
Ketones, ur: NEGATIVE mg/dL
Leukocytes,Ua: NEGATIVE
Nitrite: NEGATIVE
Protein, ur: NEGATIVE mg/dL
Specific Gravity, Urine: 1.01 (ref 1.005–1.030)
pH: 5.5 (ref 5.0–8.0)

## 2022-09-15 LAB — CBC WITH DIFFERENTIAL/PLATELET
Abs Immature Granulocytes: 0.02 10*3/uL (ref 0.00–0.07)
Basophils Absolute: 0 10*3/uL (ref 0.0–0.1)
Basophils Relative: 0 %
Eosinophils Absolute: 0.1 10*3/uL (ref 0.0–0.5)
Eosinophils Relative: 1 %
HCT: 43.3 % (ref 36.0–46.0)
Hemoglobin: 14.1 g/dL (ref 12.0–15.0)
Immature Granulocytes: 0 %
Lymphocytes Relative: 29 %
Lymphs Abs: 2.6 10*3/uL (ref 0.7–4.0)
MCH: 29.6 pg (ref 26.0–34.0)
MCHC: 32.6 g/dL (ref 30.0–36.0)
MCV: 90.8 fL (ref 80.0–100.0)
Monocytes Absolute: 0.9 10*3/uL (ref 0.1–1.0)
Monocytes Relative: 10 %
Neutro Abs: 5.4 10*3/uL (ref 1.7–7.7)
Neutrophils Relative %: 60 %
Platelets: 315 10*3/uL (ref 150–400)
RBC: 4.77 MIL/uL (ref 3.87–5.11)
RDW: 12.8 % (ref 11.5–15.5)
WBC: 9.1 10*3/uL (ref 4.0–10.5)
nRBC: 0 % (ref 0.0–0.2)

## 2022-09-15 LAB — TROPONIN I (HIGH SENSITIVITY): Troponin I (High Sensitivity): <2 ng/L (ref <18)

## 2022-09-15 LAB — D-DIMER, QUANTITATIVE: D-Dimer, Quant: <0.27 ug/mL-FEU (ref 0.00–0.50)

## 2022-09-15 NOTE — ED Triage Notes (Signed)
Right calf bruising and pain x 3 days. Also endorse generalized fatigue and dry cough.  Concerned for PE, DVT or leukemia due to family history.

## 2022-09-15 NOTE — Discharge Instructions (Signed)
Drink plenty of fluids and get plenty of rest.  Follow-up with your primary doctor if symptoms are not improving in the next week, and return to the ER if symptoms significantly worsen or change.

## 2022-09-15 NOTE — ED Provider Notes (Signed)
[place] EMERGENCY DEPARTMENT Provider Note   CSN: [id number] Arrival date & time: 09/15/22  1738     History  Chief Complaint  Patient presents with   Leg Pain   Cough    Alexis Bernard is a 46 y.o. female.  Patient is a 46 year old female with no significant past medical history.  Patient presenting today with complaints of weakness, fatigue, and right leg pain/bruising.  She has felt fatigued over the past 1 to 2 weeks, then today noticed a bruise to the side of her right calf.  She has history of blood clots and leukemia in the family and felt that she should be evaluated for these possibilities.  She denies to me she is having any chest pain or shortness of breath.  She denies fevers, chills, or cough.  The history is provided by the patient.       Home Medications Prior to Admission medications   Medication Sig Start Date End Date Taking? Authorizing Provider  albuterol (PROVENTIL HFA;VENTOLIN HFA) 108 (90 Base) MCG/ACT inhaler Inhale 2 puffs into the lungs every 6 (six) hours as needed for wheezing or shortness of breath.    [provider]  doxycycline (VIBRA-TABS) 100 MG tablet Take 1 tablet (100 mg total) by mouth 2 (two) times daily. 02/10/21   Aloha Gell, MD  ergocalciferol (VITAMIN D2) 50000 units capsule Take 50,000 Units by mouth once a week. Mondays    [provider]  HYDROcodone-acetaminophen (NORCO/VICODIN) 5-325 MG tablet Take 1-2 tablets by mouth every 6 (six) hours as needed for moderate pain or severe pain. 09/12/21   Charlesetta Shanks, MD  methocarbamol (ROBAXIN) 500 MG tablet Take 1 tablet (500 mg total) by mouth every 6 (six) hours as needed for muscle spasms. 09/12/21   Charlesetta Shanks, MD  metroNIDAZOLE (FLAGYL) 500 MG tablet Take 1 tablet (500 mg total) by mouth 2 (two) times daily. 02/10/21   Aloha Gell, MD  norethindrone (AYGESTIN) 5 MG tablet Take 1 tablet (5 mg total) by mouth in the morning and at bedtime.  02/10/21   Aloha Gell, MD  NURTEC 75 MG TBDP Take 1 tablet by mouth daily as needed (For migraine). 09/22/20   [provider]  ondansetron (ZOFRAN) 4 MG tablet Take 1 tablet (4 mg total) by mouth every 6 (six) hours as needed for nausea. 02/10/21   Aloha Gell, MD  oxyCODONE-acetaminophen (PERCOCET/ROXICET) 5-325 MG tablet Take 1-2 tablets by mouth every 3 (three) hours as needed (moderate to severe pain (when tolerating fluids)). 02/10/21   Aloha Gell, MD  pantoprazole (PROTONIX) 40 MG injection Inject 40 mg into the vein daily. 02/11/21   Aloha Gell, MD      Allergies    Latex, Peanut-containing drug products, Banana, Dog epithelium allergy skin test, Dust mite extract, Molds & smuts, Other, Oxycodone, and Strawberry extract    Review of Systems   Review of Systems  All other systems reviewed and are negative.   Physical Exam Updated Vital Signs BP 107/70   Pulse 77   Temp 98 F (36.7 C) (Oral)   Resp 18   Ht '5\' 2"'$  (1.575 m)   Wt 81.6 kg   LMP 08/11/2019   SpO2 100%   BMI 32.92 kg/m  Physical Exam Vitals and nursing note reviewed.  Constitutional:      General: She is not in acute distress.    Appearance: She is well-developed. She is not diaphoretic.  HENT:     Head: Normocephalic and  atraumatic.  Cardiovascular:     Rate and Rhythm: Normal rate and regular rhythm.     Heart sounds: No murmur heard.    No friction rub. No gallop.  Pulmonary:     Effort: Pulmonary effort is normal. No respiratory distress.     Breath sounds: Normal breath sounds. No wheezing.  Abdominal:     General: Bowel sounds are normal. There is no distension.     Palpations: Abdomen is soft.     Tenderness: There is no abdominal tenderness.  Musculoskeletal:        General: Normal range of motion.     Cervical back: Normal range of motion and neck supple.     Comments: There is a 4 cm, round, ecchymosis to the lateral aspect of the right calf.  There is no calf tenderness  and Homans' sign is absent.  DP pulses are easily palpable and motor and sensation are intact throughout the entire foot.  Skin:    General: Skin is warm and dry.  Neurological:     General: No focal deficit present.     Mental Status: She is alert and oriented to person, place, and time.     ED Results / Procedures / Treatments   Labs (all labs ordered are listed, but only abnormal results are displayed) Labs Reviewed  CBC WITH DIFFERENTIAL/PLATELET  BASIC METABOLIC PANEL  D-DIMER, QUANTITATIVE  URINALYSIS, ROUTINE W REFLEX MICROSCOPIC  TROPONIN I (HIGH SENSITIVITY)  TROPONIN I (HIGH SENSITIVITY)    EKG None  Radiology US Venous Img Lower Unilateral Right  Result Date: 09/15/2022 CLINICAL DATA:  Calf pain, bruising, and swelling EXAM: RIGHT LOWER EXTREMITY VENOUS DOPPLER ULTRASOUND TECHNIQUE: Gray-scale sonography with graded compression, as well as color Doppler and duplex ultrasound were performed to evaluate the lower extremity deep venous systems from the level of the common femoral vein and including the common femoral, femoral, profunda femoral, popliteal and calf veins including the posterior tibial, peroneal and gastrocnemius veins when visible. The superficial great saphenous vein was also interrogated. Spectral Doppler was utilized to evaluate flow at rest and with distal augmentation maneuvers in the common femoral, femoral and popliteal veins. COMPARISON:  None Available. FINDINGS: Contralateral Common Femoral Vein: Respiratory phasicity is normal and symmetric with the symptomatic side. No evidence of thrombus. Normal compressibility. Common Femoral Vein: No evidence of thrombus. Normal compressibility, respiratory phasicity and response to augmentation. Saphenofemoral Junction: No evidence of thrombus. Normal compressibility and flow on color Doppler imaging. Profunda Femoral Vein: No evidence of thrombus. Normal compressibility and flow on color Doppler imaging. Femoral  Vein: No evidence of thrombus. Normal compressibility, respiratory phasicity and response to augmentation. Popliteal Vein: No evidence of thrombus. Normal compressibility, respiratory phasicity and response to augmentation. Calf Veins: No evidence of thrombus. Normal compressibility and flow on color Doppler imaging. Superficial Great Saphenous Vein: No evidence of thrombus. Normal compressibility. Venous Reflux:  None. Other Findings:  None. IMPRESSION: No evidence of deep venous thrombosis. Electronically Signed   By: Beryle Flock M.D.   On: 09/15/2022 18:47   DG Chest 2 View  Result Date: 09/15/2022 CLINICAL DATA:  Generalized fatigue and dry cough EXAM: CHEST - 2 VIEW COMPARISON:  Chest x-ray December 21, 2016 FINDINGS: The cardiomediastinal silhouette is unchanged in contour. Low lung volumes with bronchovascular crowding. No focal pulmonary opacity. No pleural effusion or pneumothorax. Visualized upper abdomen is unremarkable. No acute osseous abnormality. IMPRESSION: No acute cardiopulmonary abnormality. Electronically Signed   By: Monica Becton.D.  On: 09/15/2022 18:35    Procedures Procedures    Medications Ordered in ED Medications - No data to display  ED Course/ Medical Decision Making/ A&P  Patient is a 46 year old female presenting with complaints of fatigue and right leg pain as described in the HPI.  She arrives here with stable vital signs, no fever, and no hypoxia.  Her physical examination is basically unremarkable with the exception of a small ecchymosis to the lateral aspect of the right calf.  Work-up initiated in triage including CBC, metabolic panel, and troponin.  These are all unremarkable.  A D-dimer was also obtained as well as a right lower extremity ultrasound.  Neither of these were suggestive of DVT and D-dimer was negative.  Chest x-ray is clear.  At this point, the cause of her fatigue is unclear, however nothing today appears emergent.  Symptoms may be  viral in nature and will have patient follow-up with primary doctor if not improving in the next week.  Final Clinical Impression(s) / ED Diagnoses Final diagnoses:  None    Rx / DC Orders ED Discharge Orders     None         Veryl Speak, MD 09/15/22 2342

## 2022-10-10 DIAGNOSIS — M25572 Pain in left ankle and joints of left foot: Secondary | ICD-10-CM | POA: Diagnosis not present

## 2022-10-10 DIAGNOSIS — S838X2A Sprain of other specified parts of left knee, initial encounter: Secondary | ICD-10-CM | POA: Diagnosis not present

## 2022-10-14 ENCOUNTER — Other Ambulatory Visit: Payer: Self-pay

## 2022-10-14 ENCOUNTER — Emergency Department (HOSPITAL_BASED_OUTPATIENT_CLINIC_OR_DEPARTMENT_OTHER): Payer: BC Managed Care – PPO

## 2022-10-14 ENCOUNTER — Emergency Department (HOSPITAL_BASED_OUTPATIENT_CLINIC_OR_DEPARTMENT_OTHER)
Admission: EM | Admit: 2022-10-14 | Discharge: 2022-10-14 | Disposition: A | Discharge status: Home / Self Care | Payer: BC Managed Care – PPO | Attending: Emergency Medicine | Admitting: Emergency Medicine

## 2022-10-14 DIAGNOSIS — M25562 Pain in left knee: Secondary | ICD-10-CM | POA: Insufficient documentation

## 2022-10-14 DIAGNOSIS — W010XXA Fall on same level from slipping, tripping and stumbling without subsequent striking against object, initial encounter: Secondary | ICD-10-CM | POA: Insufficient documentation

## 2022-10-14 DIAGNOSIS — M25572 Pain in left ankle and joints of left foot: Secondary | ICD-10-CM | POA: Insufficient documentation

## 2022-10-14 DIAGNOSIS — M25561 Pain in right knee: Secondary | ICD-10-CM | POA: Insufficient documentation

## 2022-10-14 DIAGNOSIS — M549 Dorsalgia, unspecified: Secondary | ICD-10-CM

## 2022-10-14 DIAGNOSIS — W19XXXA Unspecified fall, initial encounter: Secondary | ICD-10-CM

## 2022-10-14 DIAGNOSIS — S93402A Sprain of unspecified ligament of left ankle, initial encounter: Secondary | ICD-10-CM | POA: Diagnosis not present

## 2022-10-14 DIAGNOSIS — Z91040 Latex allergy status: Secondary | ICD-10-CM | POA: Insufficient documentation

## 2022-10-14 DIAGNOSIS — Y92512 Supermarket, store or market as the place of occurrence of the external cause: Secondary | ICD-10-CM | POA: Insufficient documentation

## 2022-10-14 DIAGNOSIS — Z91010 Allergy to peanuts: Secondary | ICD-10-CM | POA: Diagnosis not present

## 2022-10-14 DIAGNOSIS — M546 Pain in thoracic spine: Secondary | ICD-10-CM | POA: Insufficient documentation

## 2022-10-14 DIAGNOSIS — M545 Low back pain, unspecified: Secondary | ICD-10-CM | POA: Diagnosis not present

## 2022-10-14 MED ORDER — NAPROXEN 500 MG PO TABS
500.0000 mg | ORAL_TABLET | Freq: Two times a day (BID) | ORAL | 0 refills | Status: AC
Start: 2022-10-14 — End: ?

## 2022-10-14 MED ORDER — METHOCARBAMOL 500 MG PO TABS: 1000.0000 mg | ORAL_TABLET | Freq: Four times a day (QID) | ORAL | 0 refills | Status: AC

## 2022-10-14 MED ORDER — KETOROLAC TROMETHAMINE 15 MG/ML IJ SOLN
30.0000 mg | Freq: Once | INTRAMUSCULAR | Status: AC
  Administered 2022-10-14: 30 mg via INTRAMUSCULAR
  Filled 2022-10-14: qty 2

## 2022-10-14 MED ORDER — METHOCARBAMOL 500 MG PO TABS
1000.0000 mg | ORAL_TABLET | Freq: Once | ORAL | Status: AC
  Administered 2022-10-14: 1000 mg via ORAL
  Filled 2022-10-14: qty 2

## 2022-10-14 NOTE — ED Triage Notes (Signed)
Slipped and fell at the grocery store landed on both knees and then twisted left ankle. Pt complains of bilateral knee pain, left ankle pain and lower back pain and upper back pain. Pt denies hitting head and denies LOC.

## 2022-10-14 NOTE — Discharge Instructions (Signed)
Please read and follow all provided instructions.  Your diagnoses today include:  1. Fall, initial encounter   2. Acute left ankle pain   3. Acute pain of both knees   4. Acute bilateral back pain, unspecified back location     Tests performed today include: Vital signs - see below for your results today X-ray of the knees and left ankle -does not show fracture  Medications prescribed:  Naproxen - anti-inflammatory pain medication Do not exceed '500mg'$  naproxen every 12 hours, take with food  You have been prescribed an anti-inflammatory medication or NSAID. Take with food. Take smallest effective dose for the shortest duration needed for your pain. Stop taking if you experience stomach pain or vomiting.   Robaxin (methocarbamol) - muscle relaxer medication  DO NOT drive or perform any activities that require you to be awake and alert because this medicine can make you drowsy.   Take any prescribed medications only as directed.  Home care instructions:  Follow any educational materials contained in this packet Please rest, use ice or heat on your back for the next several days Do not lift, push, pull anything more than 10 pounds for the next week  Follow-up instructions: Please follow-up with your primary care provider in the next 1 week for further evaluation of your symptoms.   Return instructions:  SEEK IMMEDIATE MEDICAL ATTENTION IF YOU HAVE: New numbness, tingling, weakness, or problem with the use of your arms or legs Severe back pain not relieved with medications Loss control of your bowels or bladder Increasing pain in any areas of the body (such as chest or abdominal pain) Shortness of breath, dizziness, or fainting.  Worsening nausea (feeling sick to your stomach), vomiting, fever, or sweats Any other emergent concerns regarding your health   Additional Information:  Your vital signs today were: BP 115/73 (BP Location: Right Arm)   Pulse 74   Temp 97.9 F (36.6  C) (Oral)   Resp 16   Ht '5\' 2"'$  (1.575 m)   Wt 81.6 kg   LMP 08/11/2019   SpO2 100%   BMI 32.90 kg/m  If your blood pressure (BP) was elevated above 135/85 this visit, please have this repeated by your doctor within one month. --------------
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
## 2022-10-16 DIAGNOSIS — G43909 Migraine, unspecified, not intractable, without status migrainosus: Secondary | ICD-10-CM | POA: Diagnosis not present

## 2022-10-16 DIAGNOSIS — M25562 Pain in left knee: Secondary | ICD-10-CM | POA: Diagnosis not present

## 2022-10-16 DIAGNOSIS — M542 Cervicalgia: Secondary | ICD-10-CM | POA: Diagnosis not present

## 2022-10-16 DIAGNOSIS — M79605 Pain in left leg: Secondary | ICD-10-CM | POA: Diagnosis not present

## 2022-10-16 DIAGNOSIS — M545 Low back pain, unspecified: Secondary | ICD-10-CM | POA: Diagnosis not present

## 2022-10-16 DIAGNOSIS — K219 Gastro-esophageal reflux disease without esophagitis: Secondary | ICD-10-CM | POA: Diagnosis not present

## 2022-10-16 DIAGNOSIS — G43119 Migraine with aura, intractable, without status migrainosus: Secondary | ICD-10-CM | POA: Diagnosis not present

## 2022-10-16 DIAGNOSIS — G8929 Other chronic pain: Secondary | ICD-10-CM | POA: Diagnosis not present

## 2022-10-16 DIAGNOSIS — Z6831 Body mass index (BMI) 31.0-31.9, adult: Secondary | ICD-10-CM | POA: Diagnosis not present

## 2022-10-23 DIAGNOSIS — M5416 Radiculopathy, lumbar region: Secondary | ICD-10-CM | POA: Diagnosis not present

## 2022-10-23 DIAGNOSIS — M5412 Radiculopathy, cervical region: Secondary | ICD-10-CM | POA: Diagnosis not present

## 2022-10-23 DIAGNOSIS — M542 Cervicalgia: Secondary | ICD-10-CM | POA: Diagnosis not present

## 2022-11-06 DIAGNOSIS — R110 Nausea: Secondary | ICD-10-CM | POA: Diagnosis not present

## 2022-11-06 DIAGNOSIS — K7689 Other specified diseases of liver: Secondary | ICD-10-CM | POA: Diagnosis not present

## 2022-11-06 DIAGNOSIS — R918 Other nonspecific abnormal finding of lung field: Secondary | ICD-10-CM | POA: Diagnosis not present

## 2022-11-06 DIAGNOSIS — R072 Precordial pain: Secondary | ICD-10-CM | POA: Diagnosis not present

## 2022-11-06 DIAGNOSIS — R519 Headache, unspecified: Secondary | ICD-10-CM | POA: Diagnosis not present

## 2022-11-06 DIAGNOSIS — Z1152 Encounter for screening for COVID-19: Secondary | ICD-10-CM | POA: Diagnosis not present

## 2022-11-06 DIAGNOSIS — R109 Unspecified abdominal pain: Secondary | ICD-10-CM | POA: Diagnosis not present

## 2022-11-06 DIAGNOSIS — R197 Diarrhea, unspecified: Secondary | ICD-10-CM | POA: Diagnosis not present

## 2022-11-06 DIAGNOSIS — R059 Cough, unspecified: Secondary | ICD-10-CM | POA: Diagnosis not present

## 2022-11-06 DIAGNOSIS — A084 Viral intestinal infection, unspecified: Secondary | ICD-10-CM | POA: Diagnosis not present

## 2023-01-05 DIAGNOSIS — M5117 Intervertebral disc disorders with radiculopathy, lumbosacral region: Secondary | ICD-10-CM | POA: Diagnosis not present

## 2023-01-05 DIAGNOSIS — Z981 Arthrodesis status: Secondary | ICD-10-CM | POA: Diagnosis not present

## 2023-01-05 DIAGNOSIS — M4722 Other spondylosis with radiculopathy, cervical region: Secondary | ICD-10-CM | POA: Diagnosis not present

## 2023-01-15 DIAGNOSIS — G4489 Other headache syndrome: Secondary | ICD-10-CM | POA: Diagnosis not present

## 2023-01-15 DIAGNOSIS — R519 Headache, unspecified: Secondary | ICD-10-CM | POA: Diagnosis not present

## 2023-01-15 DIAGNOSIS — R0789 Other chest pain: Secondary | ICD-10-CM | POA: Diagnosis not present

## 2023-01-15 DIAGNOSIS — R410 Disorientation, unspecified: Secondary | ICD-10-CM | POA: Diagnosis not present

## 2023-01-15 DIAGNOSIS — R279 Unspecified lack of coordination: Secondary | ICD-10-CM | POA: Diagnosis not present

## 2023-01-15 DIAGNOSIS — R079 Chest pain, unspecified: Secondary | ICD-10-CM | POA: Diagnosis not present

## 2023-01-15 DIAGNOSIS — R002 Palpitations: Secondary | ICD-10-CM | POA: Diagnosis not present

## 2023-01-15 DIAGNOSIS — E0789 Other specified disorders of thyroid: Secondary | ICD-10-CM | POA: Diagnosis not present

## 2023-01-15 DIAGNOSIS — R030 Elevated blood-pressure reading, without diagnosis of hypertension: Secondary | ICD-10-CM | POA: Diagnosis not present

## 2023-01-15 DIAGNOSIS — I639 Cerebral infarction, unspecified: Secondary | ICD-10-CM | POA: Diagnosis not present

## 2023-01-15 DIAGNOSIS — I10 Essential (primary) hypertension: Secondary | ICD-10-CM | POA: Diagnosis not present

## 2023-01-15 DIAGNOSIS — G44209 Tension-type headache, unspecified, not intractable: Secondary | ICD-10-CM | POA: Diagnosis not present

## 2023-01-15 DIAGNOSIS — R531 Weakness: Secondary | ICD-10-CM | POA: Diagnosis not present

## 2023-01-15 DIAGNOSIS — G459 Transient cerebral ischemic attack, unspecified: Secondary | ICD-10-CM | POA: Diagnosis not present

## 2023-01-16 DIAGNOSIS — R002 Palpitations: Secondary | ICD-10-CM | POA: Diagnosis not present

## 2023-01-16 DIAGNOSIS — R0789 Other chest pain: Secondary | ICD-10-CM | POA: Diagnosis not present

## 2023-01-16 DIAGNOSIS — G44209 Tension-type headache, unspecified, not intractable: Secondary | ICD-10-CM | POA: Diagnosis not present

## 2023-01-18 DIAGNOSIS — Z20822 Contact with and (suspected) exposure to covid-19: Secondary | ICD-10-CM | POA: Diagnosis not present

## 2023-01-18 DIAGNOSIS — J020 Streptococcal pharyngitis: Secondary | ICD-10-CM | POA: Diagnosis not present

## 2023-01-18 DIAGNOSIS — R059 Cough, unspecified: Secondary | ICD-10-CM | POA: Diagnosis not present

## 2023-03-21 IMAGING — US US PELVIS COMPLETE
1 series · 13 of 25 positions shown · non-contrast
Comparison: Pelvic ultrasound and CT abdomen pelvis dated
02/08/2021.

CLINICAL DATA: 45-year-old female with salpingitis and drop in
hemoglobin. Evaluate for pelvic hemorrhage.

EXAM:
TRANSABDOMINAL AND TRANSVAGINAL ULTRASOUND OF PELVIS
DOPPLER ULTRASOUND OF OVARIES
TECHNIQUE: Both transabdominal and transvaginal ultrasound examinations of the
pelvis were performed. Transabdominal technique was performed for
global imaging of the pelvis including uterus, ovaries, adnexal
regions, and pelvic cul-de-sac.
It was necessary to proceed with endovaginal exam following the
transabdominal exam to visualize the endometrium and ovaries. Color
and duplex Doppler ultrasound was utilized to evaluate blood flow to
the ovaries.

[Series 1: us pelvis (transabdominal only) · 62 acquisitions, 13 frames shown]
[im 1/62]
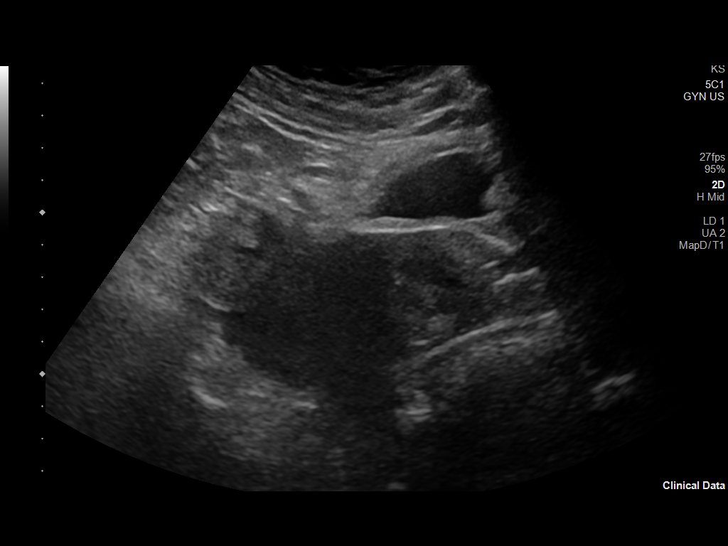
[im 6/62]
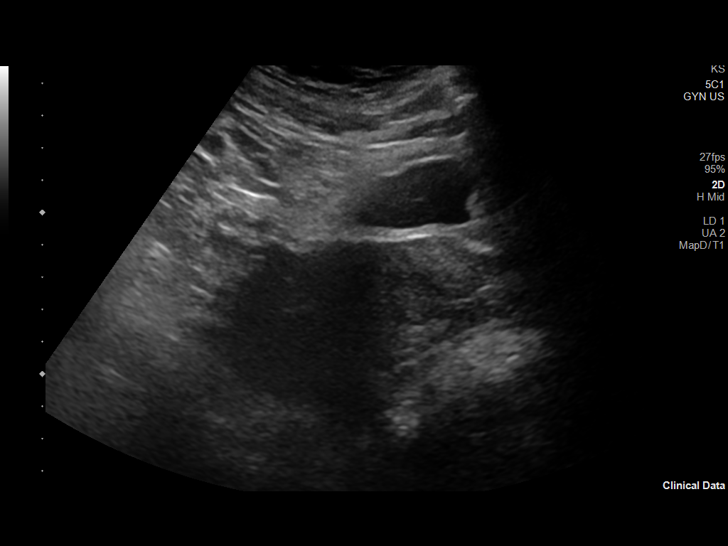
[im 11/62]
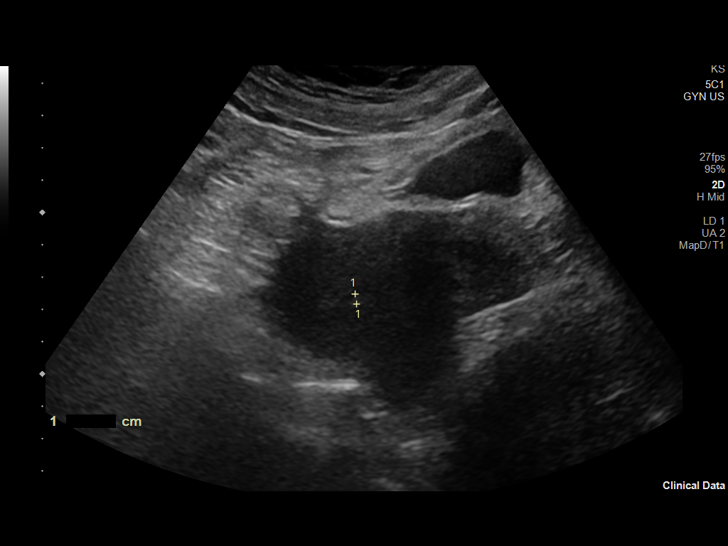
[im 16/62]
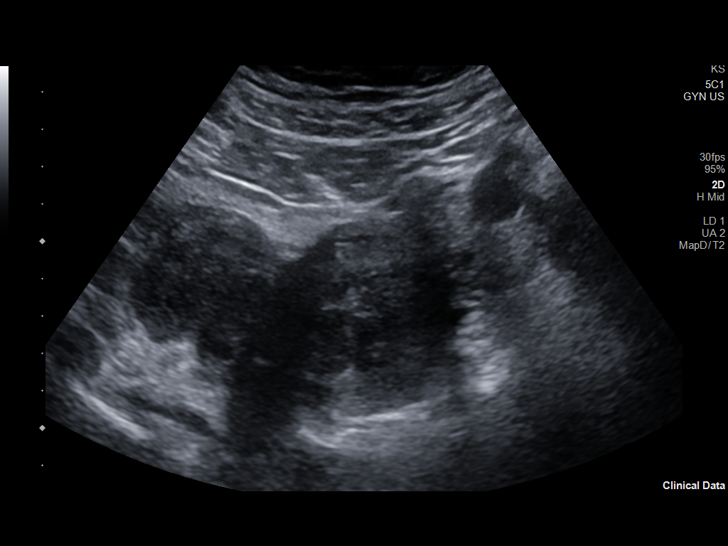
[im 21/62]
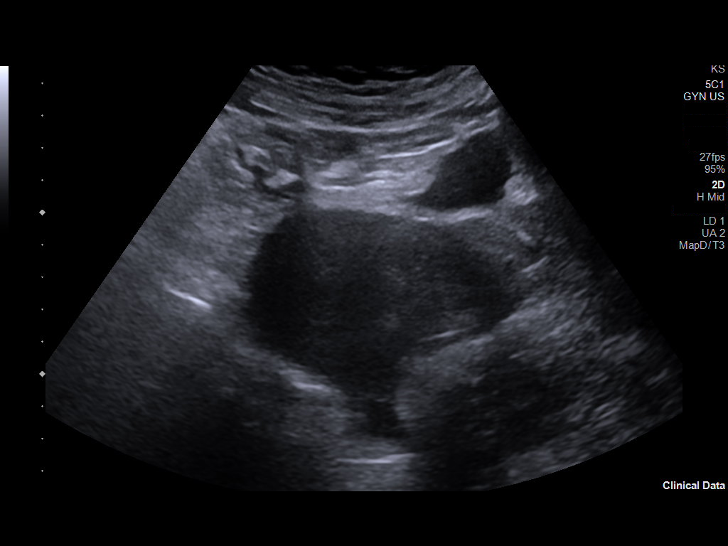
[im 26/62]
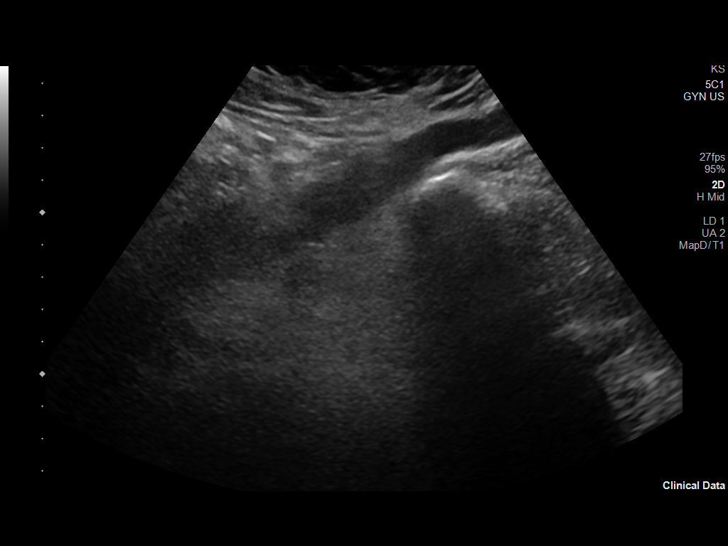
[im 31/62]
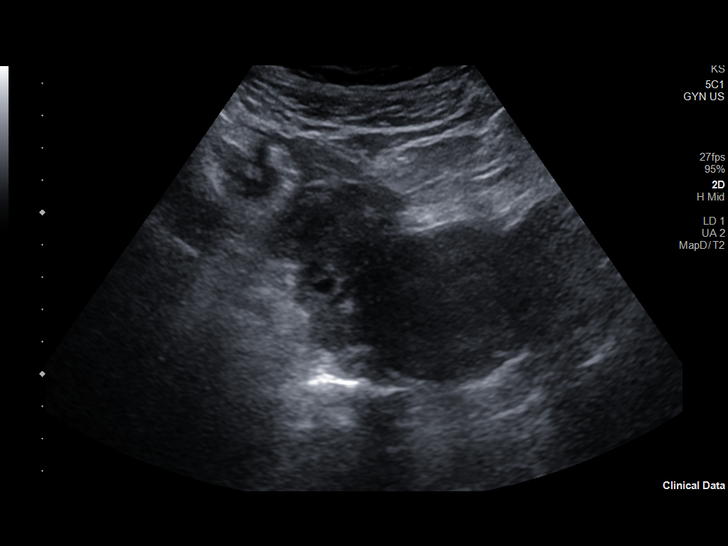
[im 36/62]
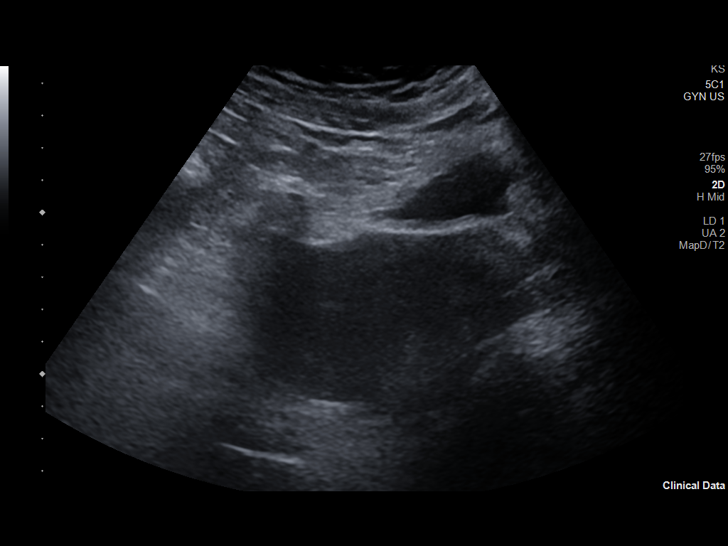
[im 41/62]
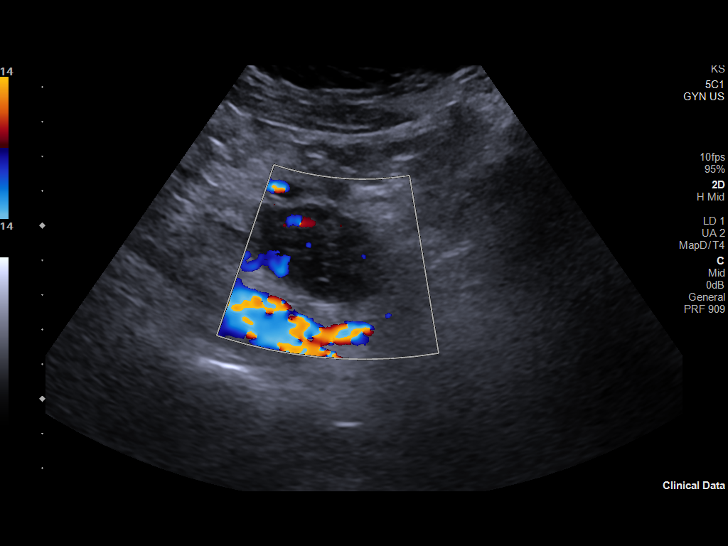
[im 46/62]
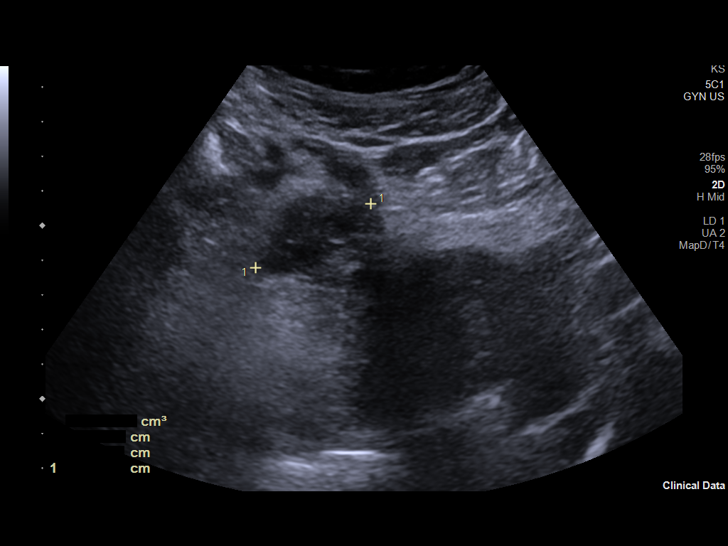
[im 51/62]
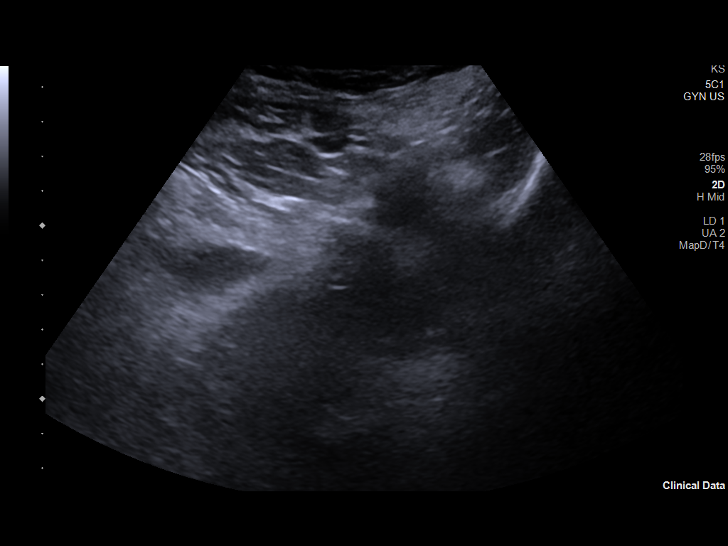
[im 56/62]
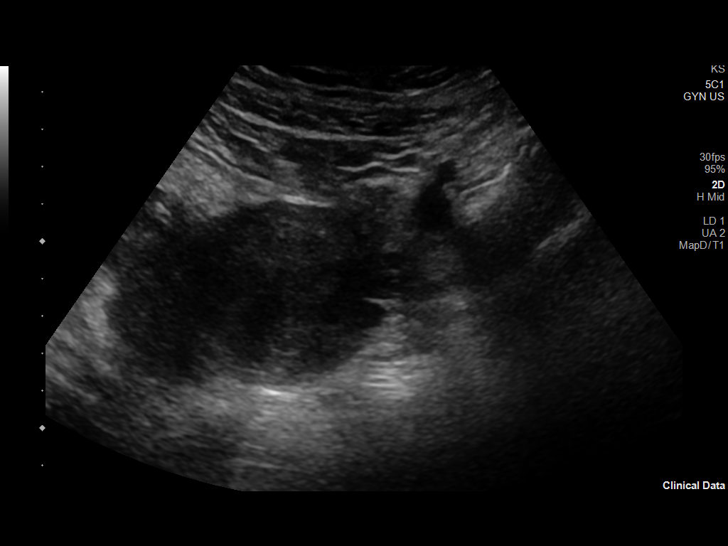
[im 62/62]
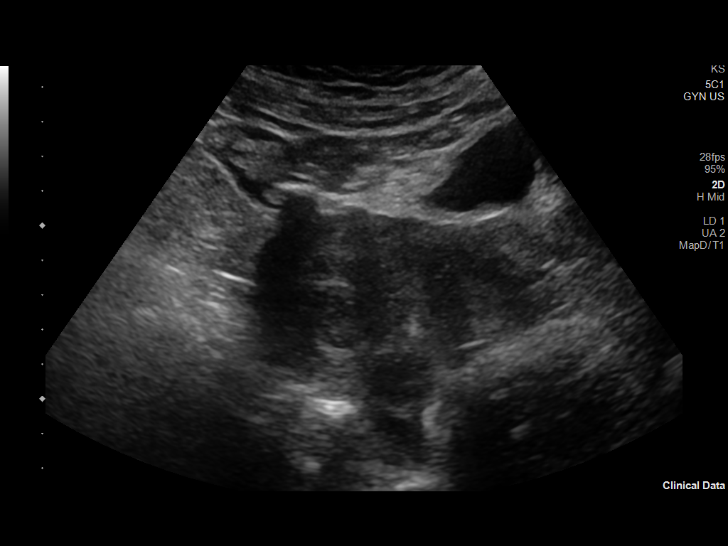

[13 of 25 positions shown; findings below may reference images not displayed]

FINDINGS: Uterus

Measurements: 8.3 x 4.5 x 5.8 cm = volume: 112 mL. The uterus is
anteverted. There is a 3.0 x 2.8 x 3.5 cm right posterior body
submucosal fibroid.

Endometrium

Thickness: 4 mm.  No focal abnormality visualized.

Right ovary

Measurements: 4.2 x 2.6 x 3.8 cm = volume: 22 mL. Normal
appearance/no adnexal mass.

Left ovary

Measurements: 4.7 x 2.9 x 3.5 cm = volume: 25 mL. Normal
appearance/no adnexal mass.

There is mildly dilated tubular structure with thickened wall in the
right adnexa consistent with dilated fallopian tube and in keeping
with salpingitis. There has been interval decrease in the dilatation
of the fallopian tube compared to the prior ultrasound. No fluid
collection or abscess identified.

Pulsed Doppler evaluation of both ovaries demonstrates normal
low-resistance arterial and venous waveforms.

Other findings

No abnormal free fluid.
IMPRESSION: 1. Interval decrease in the degree of dilatation of the right
fallopian tube compared to the prior ultrasound suggestive of
improving right salpingitis. No fluid collection or abscess. No
hematoma identified.
2. Small posterior body submucosal fibroid.
3. Unremarkable ovaries.

## 2023-06-17 ENCOUNTER — Encounter (HOSPITAL_BASED_OUTPATIENT_CLINIC_OR_DEPARTMENT_OTHER): Payer: Self-pay

## 2023-06-17 ENCOUNTER — Emergency Department (HOSPITAL_BASED_OUTPATIENT_CLINIC_OR_DEPARTMENT_OTHER): Payer: BC Managed Care – PPO

## 2023-06-17 ENCOUNTER — Emergency Department (HOSPITAL_BASED_OUTPATIENT_CLINIC_OR_DEPARTMENT_OTHER)
Admission: EM | Admit: 2023-06-17 | Discharge: 2023-06-17 | Disposition: A | Discharge status: Home / Self Care | Payer: BC Managed Care – PPO | Attending: Emergency Medicine | Admitting: Emergency Medicine

## 2023-06-17 DIAGNOSIS — Z91040 Latex allergy status: Secondary | ICD-10-CM | POA: Insufficient documentation

## 2023-06-17 DIAGNOSIS — Z91010 Allergy to peanuts: Secondary | ICD-10-CM | POA: Diagnosis not present

## 2023-06-17 DIAGNOSIS — J45909 Unspecified asthma, uncomplicated: Secondary | ICD-10-CM | POA: Diagnosis not present

## 2023-06-17 DIAGNOSIS — G43809 Other migraine, not intractable, without status migrainosus: Secondary | ICD-10-CM | POA: Insufficient documentation

## 2023-06-17 DIAGNOSIS — R519 Headache, unspecified: Secondary | ICD-10-CM | POA: Diagnosis not present

## 2023-06-17 HISTORY — DX: Migraine, unspecified, not intractable, without status migrainosus: G43.909

## 2023-06-17 LAB — CBC WITH DIFFERENTIAL/PLATELET
Abs Immature Granulocytes: 0.02 10*3/uL (ref 0.00–0.07)
Basophils Absolute: 0 10*3/uL (ref 0.0–0.1)
Basophils Relative: 0 %
Eosinophils Absolute: 0.1 10*3/uL (ref 0.0–0.5)
Eosinophils Relative: 1 %
HCT: 40.7 % (ref 36.0–46.0)
Hemoglobin: 13.5 g/dL (ref 12.0–15.0)
Immature Granulocytes: 0 %
Lymphocytes Relative: 31 %
Lymphs Abs: 2.5 10*3/uL (ref 0.7–4.0)
MCH: 29.6 pg (ref 26.0–34.0)
MCHC: 33.2 g/dL (ref 30.0–36.0)
MCV: 89.3 fL (ref 80.0–100.0)
Monocytes Absolute: 0.5 10*3/uL (ref 0.1–1.0)
Monocytes Relative: 7 %
Neutro Abs: 5 10*3/uL (ref 1.7–7.7)
Neutrophils Relative %: 61 %
Platelets: 257 10*3/uL (ref 150–400)
RBC: 4.56 MIL/uL (ref 3.87–5.11)
RDW: 12.4 % (ref 11.5–15.5)
WBC: 8.1 10*3/uL (ref 4.0–10.5)
nRBC: 0 % (ref 0.0–0.2)

## 2023-06-17 LAB — URINALYSIS, ROUTINE W REFLEX MICROSCOPIC
Bacteria, UA: NONE SEEN
Bilirubin Urine: NEGATIVE
Glucose, UA: NEGATIVE mg/dL
Hgb urine dipstick: NEGATIVE
Ketones, ur: NEGATIVE mg/dL
Leukocytes,Ua: NEGATIVE
Nitrite: NEGATIVE
Protein, ur: NEGATIVE mg/dL
Specific Gravity, Urine: 1.006 (ref 1.005–1.030)
pH: 5.5 (ref 5.0–8.0)

## 2023-06-17 LAB — BASIC METABOLIC PANEL
Anion gap: 7 (ref 5–15)
BUN: 18 mg/dL (ref 6–20)
CO2: 30 mmol/L (ref 22–32)
Calcium: 9.8 mg/dL (ref 8.9–10.3)
Chloride: 101 mmol/L (ref 98–111)
Creatinine, Ser: 0.71 mg/dL (ref 0.44–1.00)
GFR, Estimated: >60 mL/min (ref >60)
Glucose, Bld: 123 mg/dL — ABNORMAL HIGH (ref 70–99)
Potassium: 4.1 mmol/L (ref 3.5–5.1)
Sodium: 138 mmol/L (ref 135–145)

## 2023-06-17 LAB — D-DIMER, QUANTITATIVE: D-Dimer, Quant: <0.27 ug/mL-FEU (ref 0.00–0.50)

## 2023-06-17 MED ORDER — DIPHENHYDRAMINE HCL 50 MG/ML IJ SOLN
12.5000 mg | Freq: Once | INTRAMUSCULAR | Status: AC
  Administered 2023-06-17: 12.5 mg via INTRAVENOUS
  Filled 2023-06-17: qty 1

## 2023-06-17 MED ORDER — METOCLOPRAMIDE HCL 5 MG/ML IJ SOLN
10.0000 mg | Freq: Once | INTRAMUSCULAR | Status: AC
  Administered 2023-06-17: 10 mg via INTRAVENOUS
  Filled 2023-06-17: qty 2

## 2023-06-17 MED ORDER — SODIUM CHLORIDE 0.9 % IV BOLUS
1000.0000 mL | Freq: Once | INTRAVENOUS | Status: AC
  Administered 2023-06-17: 1000 mL via INTRAVENOUS

## 2023-06-17 MED ORDER — KETOROLAC TROMETHAMINE 15 MG/ML IJ SOLN
15.0000 mg | Freq: Once | INTRAMUSCULAR | Status: AC
  Administered 2023-06-17: 15 mg via INTRAVENOUS
  Filled 2023-06-17: qty 1

## 2023-06-17 MED ORDER — ONDANSETRON 4 MG PO TBDP: 4.0000 mg | ORAL_TABLET | Freq: Three times a day (TID) | ORAL | 0 refills | Status: AC | PRN

## 2023-06-17 MED ORDER — SODIUM CHLORIDE 0.9 % IV SOLN: INTRAVENOUS | Status: DC

## 2023-06-17 NOTE — ED Notes (Signed)
Returned from CT.

## 2023-06-17 NOTE — ED Provider Notes (Signed)
[place] EMERGENCY DEPARTMENT AT [facility] Provider Note   CSN: [id number] Arrival date & time: 06/17/23  1932     History  Chief Complaint  Patient presents with   Headache    Alexis Bernard is a 47 y.o. female.  Pt is a 47 yo female with pmhx significant for migraines, asthma, spinal stenosis, and sjogren's.  Pt said she's had a migraine for several days.  She feels like this is worse than usual.  She did go to pcp and was sent here for further eval.       Home Medications Prior to Admission medications   Medication Sig Start Date End Date Taking? Authorizing Provider  ondansetron (ZOFRAN-ODT) 4 MG disintegrating tablet Take 1 tablet (4 mg total) by mouth every 8 (eight) hours as needed for nausea or vomiting. 06/17/23  Yes Jacalyn Lefevre, MD  albuterol (PROVENTIL HFA;VENTOLIN HFA) 108 (90 Base) MCG/ACT inhaler Inhale 2 puffs into the lungs every 6 (six) hours as needed for wheezing or shortness of breath.    [provider]  doxycycline (VIBRA-TABS) 100 MG tablet Take 1 tablet (100 mg total) by mouth 2 (two) times daily. 02/10/21   Noland Fordyce, MD  ergocalciferol (VITAMIN D2) 50000 units capsule Take 50,000 Units by mouth once a week. Mondays    [provider]  HYDROcodone-acetaminophen (NORCO/VICODIN) 5-325 MG tablet Take 1-2 tablets by mouth every 6 (six) hours as needed for moderate pain or severe pain. 09/12/21   Arby Barrette, MD  methocarbamol (ROBAXIN) 500 MG tablet Take 2 tablets (1,000 mg total) by mouth 4 (four) times daily. 10/14/22   Renne Crigler, PA-C  metroNIDAZOLE (FLAGYL) 500 MG tablet Take 1 tablet (500 mg total) by mouth 2 (two) times daily. 02/10/21   Noland Fordyce, MD  naproxen (NAPROSYN) 500 MG tablet Take 1 tablet (500 mg total) by mouth 2 (two) times daily. 10/14/22   Renne Crigler, PA-C  norethindrone (AYGESTIN) 5 MG tablet Take 1 tablet (5 mg total) by mouth in the morning and at bedtime. 02/10/21   Noland Fordyce, MD  NURTEC 75 MG TBDP Take 1 tablet by mouth daily as needed (For migraine). 09/22/20   [provider]  ondansetron (ZOFRAN) 4 MG tablet Take 1 tablet (4 mg total) by mouth every 6 (six) hours as needed for nausea. 02/10/21   Noland Fordyce, MD  pantoprazole (PROTONIX) 40 MG injection Inject 40 mg into the vein daily. 02/11/21   Noland Fordyce, MD      Allergies    Latex, Peanut-containing drug products, Banana, Dog epithelium (canis lupus familiaris), Dust mite extract, Molds & smuts, Other, Oxycodone, and Strawberry extract    Review of Systems   Review of Systems  Gastrointestinal:  Positive for nausea.  Neurological:  Positive for headaches.  All other systems reviewed and are negative.   Physical Exam Updated Vital Signs BP 127/85   Pulse (!) 58   Temp 98.1 F (36.7 C) (Oral)   Resp 16   Ht 5\' 2"  (1.575 m)   Wt 79.8 kg   LMP 08/11/2019   SpO2 100%   BMI 32.19 kg/m  Physical Exam Vitals and nursing note reviewed.  Constitutional:      Appearance: She is well-developed.  HENT:     Head: Normocephalic and atraumatic.     Mouth/Throat:     Mouth: Mucous membranes are moist.     Pharynx: Oropharynx is clear.  Eyes:     Extraocular Movements: Extraocular movements intact.  Pupils: Pupils are equal, round, and reactive to light.  Cardiovascular:     Rate and Rhythm: Normal rate and regular rhythm.     Heart sounds: Normal heart sounds.  Pulmonary:     Effort: Pulmonary effort is normal.     Breath sounds: Normal breath sounds.  Abdominal:     General: Bowel sounds are normal.     Palpations: Abdomen is soft.  Musculoskeletal:        General: Normal range of motion.     Cervical back: Normal range of motion and neck supple.  Skin:    General: Skin is warm.     Capillary Refill: Capillary refill takes less than 2 seconds.  Neurological:     Mental Status: She is alert and oriented to person, place, and time.  Psychiatric:        Mood and  Affect: Mood normal.        Speech: Speech normal.        Behavior: Behavior normal.     ED Results / Procedures / Treatments   Labs (all labs ordered are listed, but only abnormal results are displayed) Labs Reviewed  BASIC METABOLIC PANEL - Abnormal; Notable for the following components:      Result Value   Glucose, Bld 123 (*)    All other components within normal limits  URINALYSIS, ROUTINE W REFLEX MICROSCOPIC - Abnormal; Notable for the following components:   Color, Urine COLORLESS (*)    All other components within normal limits  CBC WITH DIFFERENTIAL/PLATELET  D-DIMER, QUANTITATIVE    EKG None  Radiology CT HEAD WO CONTRAST  Result Date: 06/17/2023 CLINICAL DATA:  Right-sided headache. EXAM: CT HEAD WITHOUT CONTRAST TECHNIQUE: Contiguous axial images were obtained from the base of the skull through the vertex without intravenous contrast. RADIATION DOSE REDUCTION: This exam was performed according to the departmental dose-optimization program which includes automated exposure control, adjustment of the mA and/or kV according to patient size and/or use of iterative reconstruction technique. COMPARISON:  None Available. FINDINGS: Brain: No evidence of acute infarction, hemorrhage, hydrocephalus, extra-axial collection or mass lesion/mass effect. Vascular: No hyperdense vessel or unexpected calcification. Skull: Normal. Negative for fracture or focal lesion. Sinuses/Orbits: No acute finding. Other: None. IMPRESSION: Normal head CT. Electronically Signed   By: Aram Candela M.D.   On: 06/17/2023 22:21    Procedures Procedures    Medications Ordered in ED Medications  sodium chloride 0.9 % bolus 1,000 mL (1,000 mLs Intravenous New Bag/Given 06/17/23 2158)    And  0.9 %  sodium chloride infusion (0 mLs Intravenous Hold 06/17/23 2206)  metoCLOPramide (REGLAN) injection 10 mg (10 mg Intravenous Given 06/17/23 2201)  diphenhydrAMINE (BENADRYL) injection 12.5 mg (12.5 mg  Intravenous Given 06/17/23 2200)  ketorolac (TORADOL) 15 MG/ML injection 15 mg (15 mg Intravenous Given 06/17/23 2159)    ED Course/ Medical Decision Making/ A&P                                 Medical Decision Making Amount and/or Complexity of Data Reviewed Labs: ordered. Radiology: ordered.  Risk Prescription drug management.   This patient presents to the ED for concern of headache, this involves an extensive number of treatment options, and is a complaint that carries with it a high risk of complications and morbidity.  The differential diagnosis includes migraine, headache, ich   Co morbidities that complicate the patient evaluation  migraines, asthma,  spinal stenosis, and sjogren's   Additional history obtained:  Additional history obtained from epic chart review  Lab Tests:  I Ordered, and personally interpreted labs.  The pertinent results include:  cbc nl, bmp nl, ddimer neg, ua nl   Imaging Studies ordered:  I ordered imaging studies including ct head  I independently visualized and interpreted imaging which showed Normal head CT.  I agree with the radiologist interpretation   Cardiac Monitoring:  The patient was maintained on a cardiac monitor.  I personally viewed and interpreted the cardiac monitored which showed an underlying rhythm of: nsr   Medicines ordered and prescription drug management:  I ordered medication including IVFs/toradol/reglan/benadryl  for sx  Reevaluation of the patient after these medicines showed that the patient improved I have reviewed the patients home medicines and have made adjustments as needed   Test Considered:  ct   Critical Interventions:  Pain control   Problem List / ED Course:  Migraine:  pt said pain is gone after tx.  CT neg.  She is stable for d/c.  Return if worse.    Reevaluation:  After the interventions noted above, I reevaluated the patient and found that they have :improved   Social  Determinants of Health:  Lives at home   Dispostion:  After consideration of the diagnostic results and the patients response to treatment, I feel that the patent would benefit from discharge with outpatient f/u.          Final Clinical Impression(s) / ED Diagnoses Final diagnoses:  Other migraine without status migrainosus, not intractable    Rx / DC Orders ED Discharge Orders          Ordered    ondansetron (ZOFRAN-ODT) 4 MG disintegrating tablet  Every 8 hours PRN        06/17/23 2313              Jacalyn Lefevre, MD 06/17/23 2318

## 2023-06-17 NOTE — ED Triage Notes (Addendum)
Pt arrived POV for headache for 3 days, reports past 2 days headache to right side of head, today headache has moved to the left temporal and also c/o pain to cervical neck, hx of anterior cervical decompression fusion. Pt reports "suffer from migraines". Pt reports take Ubrelvy 100 mg daily for migraines. Reports nausea w/o vomiting. VSS, A&O x4, pt reports sensitivity to light.

## 2023-06-30 DIAGNOSIS — Z6831 Body mass index (BMI) 31.0-31.9, adult: Secondary | ICD-10-CM | POA: Diagnosis not present

## 2023-06-30 DIAGNOSIS — K219 Gastro-esophageal reflux disease without esophagitis: Secondary | ICD-10-CM | POA: Diagnosis not present

## 2023-06-30 DIAGNOSIS — E7800 Pure hypercholesterolemia, unspecified: Secondary | ICD-10-CM | POA: Diagnosis not present

## 2023-06-30 DIAGNOSIS — Q761 Klippel-Feil syndrome: Secondary | ICD-10-CM | POA: Diagnosis not present

## 2023-06-30 DIAGNOSIS — G43119 Migraine with aura, intractable, without status migrainosus: Secondary | ICD-10-CM | POA: Diagnosis not present

## 2023-07-05 DIAGNOSIS — Z6832 Body mass index (BMI) 32.0-32.9, adult: Secondary | ICD-10-CM | POA: Diagnosis not present

## 2023-07-05 DIAGNOSIS — U071 COVID-19: Secondary | ICD-10-CM | POA: Diagnosis not present

## 2023-07-05 DIAGNOSIS — R059 Cough, unspecified: Secondary | ICD-10-CM | POA: Diagnosis not present

## 2023-07-05 DIAGNOSIS — J069 Acute upper respiratory infection, unspecified: Secondary | ICD-10-CM | POA: Diagnosis not present

## 2023-10-21 IMAGING — CT CT ABD-PELV W/ CM
2 of 5 series · 16 of 46 positions shown, 18 images · IV contrast (Omnipaque)
Comparison: 02/08/2021

CLINICAL DATA: Abdominal abscess/infection suspected

EXAM:
CT ABDOMEN AND PELVIS WITH CONTRAST
TECHNIQUE: Multidetector CT imaging of the abdomen and pelvis was performed
using the standard protocol following bolus administration of
intravenous contrast.
CONTRAST:  100mL OMNIPAQUE IOHEXOL 300 MG/ML  SOLN

[Series 2: axial st · axial · 0.91mm/px · z∈[-662,-228]mm · 13 of 97 slices shown, 15 images]
[im 5/97  soft-tissue]
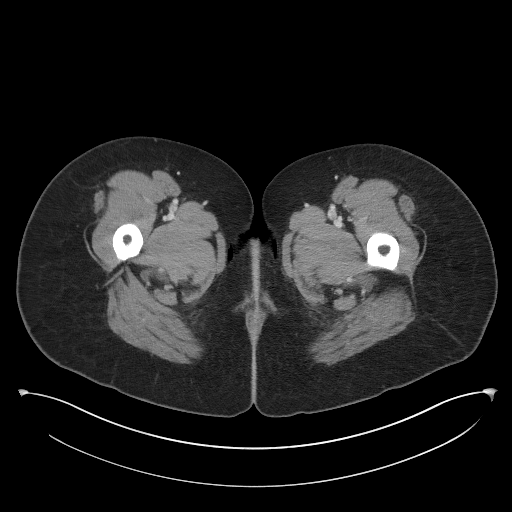
[im 5/97  bone]
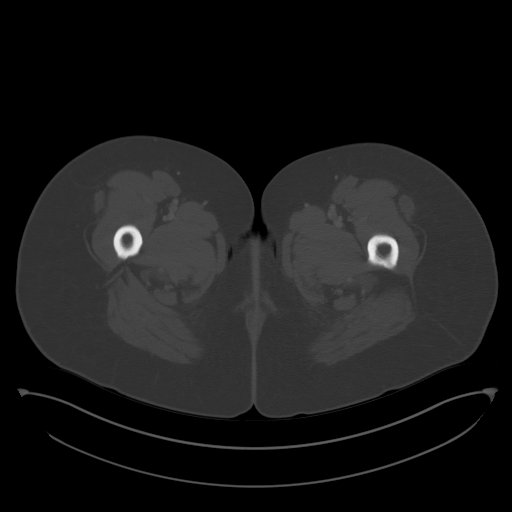
[im 15/97  soft-tissue]
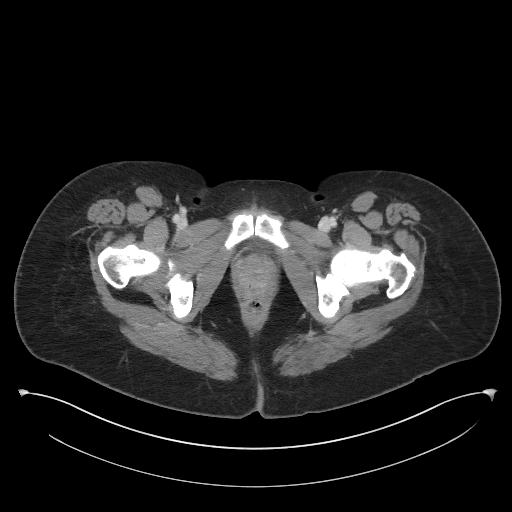
[im 20/97  soft-tissue]
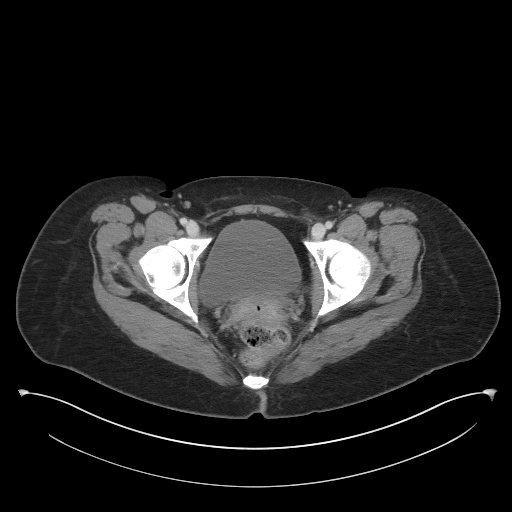
[im 29/97  soft-tissue]
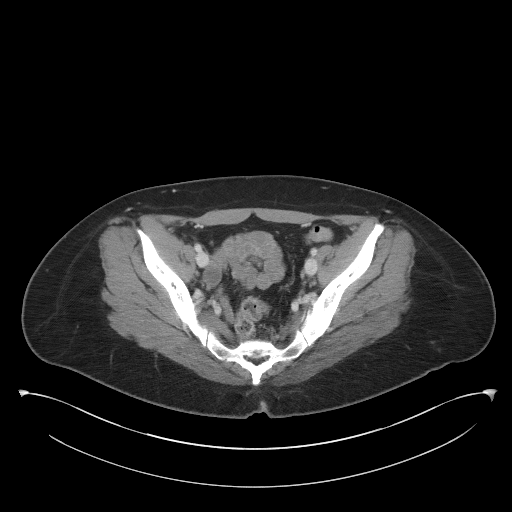
[im 34/97  soft-tissue]
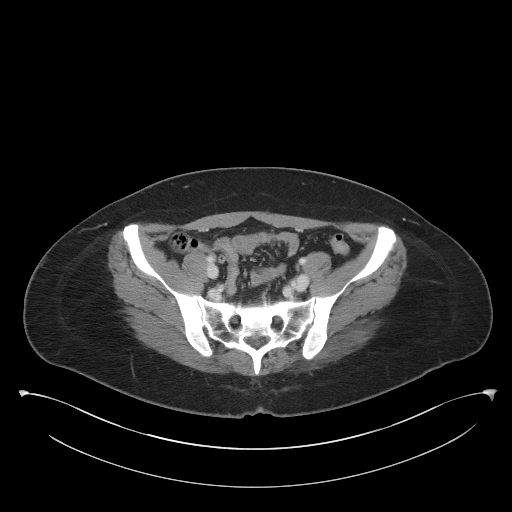
[im 44/97  soft-tissue]
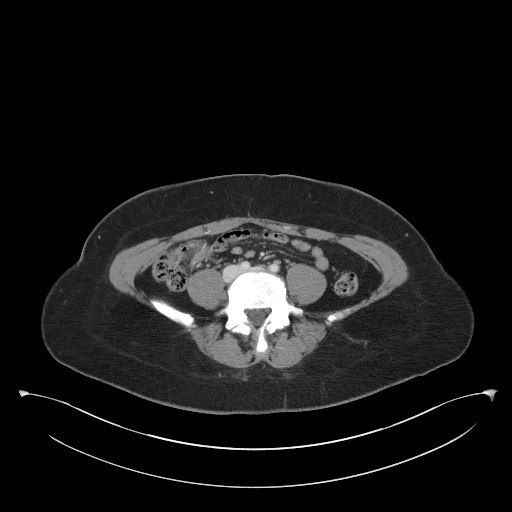
[im 49/97  soft-tissue]
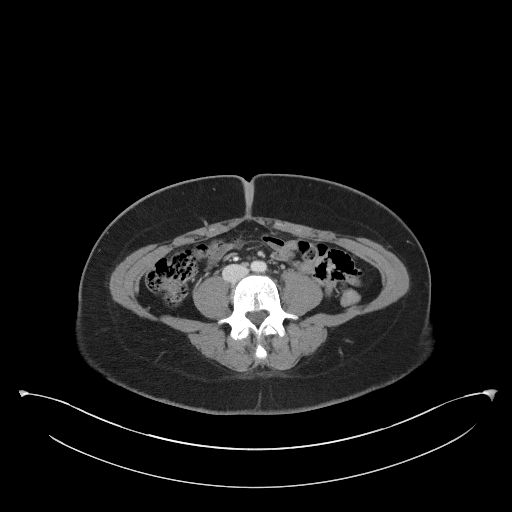
[im 53/97  soft-tissue]
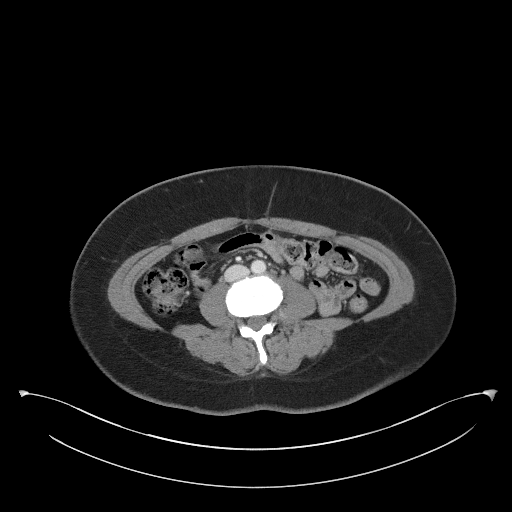
[im 63/97  soft-tissue]
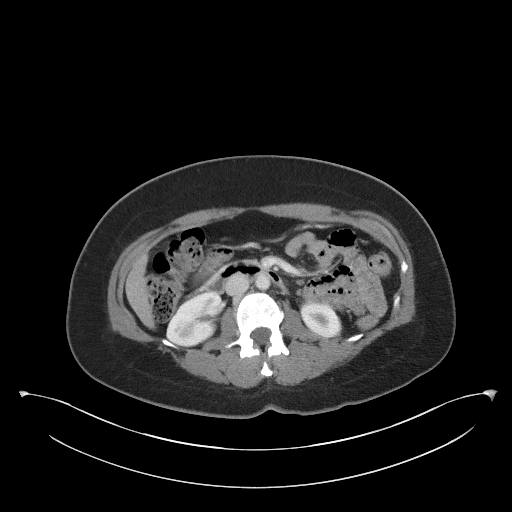
[im 63/97  bone]
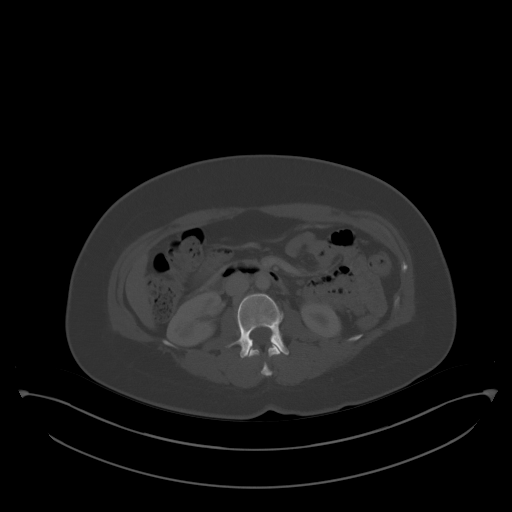
[im 68/97  soft-tissue]
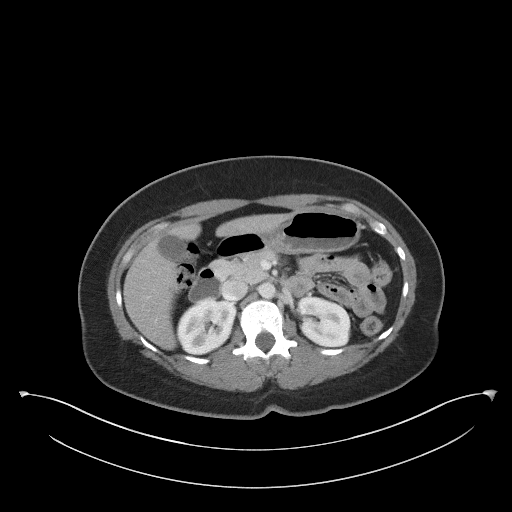
[im 77/97  soft-tissue]
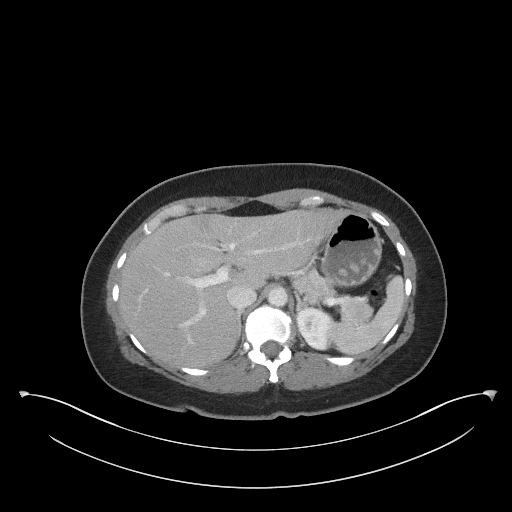
[im 82/97  soft-tissue]
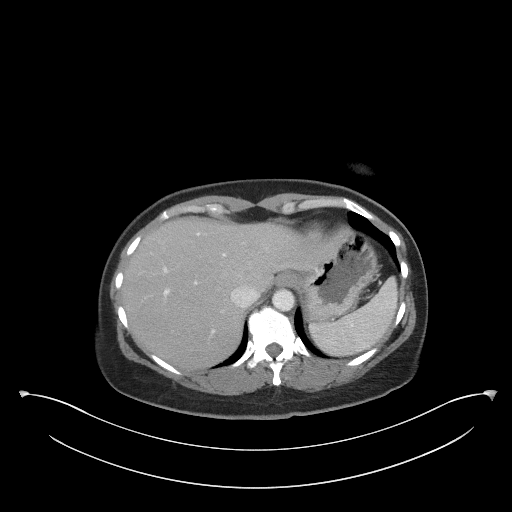
[im 92/97  soft-tissue]
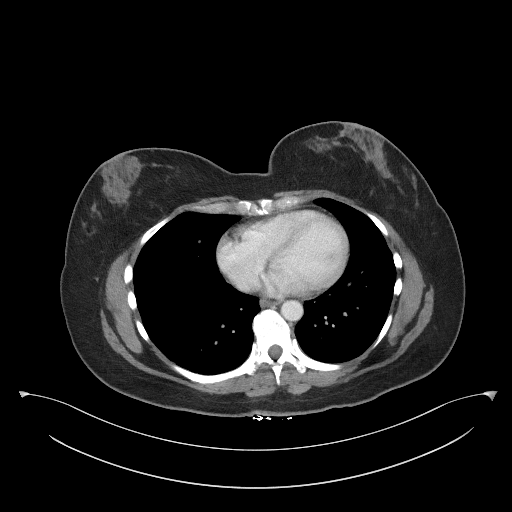

[Series 5: coronal st · coronal · 0.67mm/px · 3 of 101 slices shown]
[im 34/101  soft-tissue]
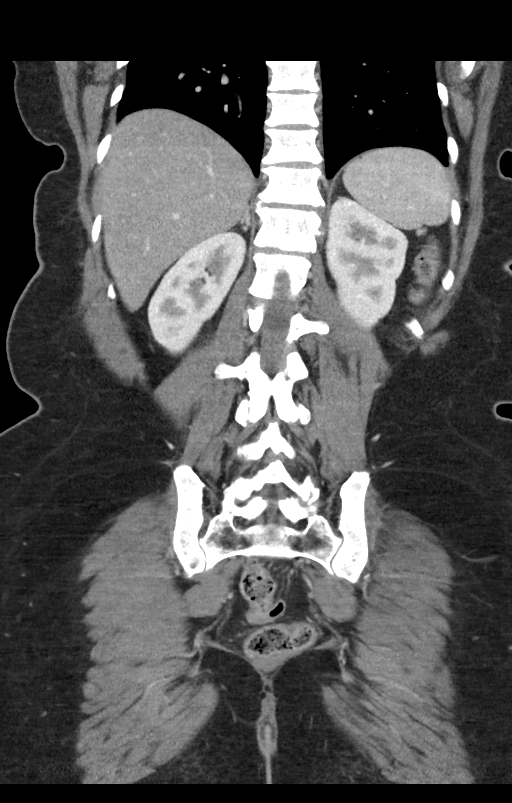
[im 45/101  soft-tissue]
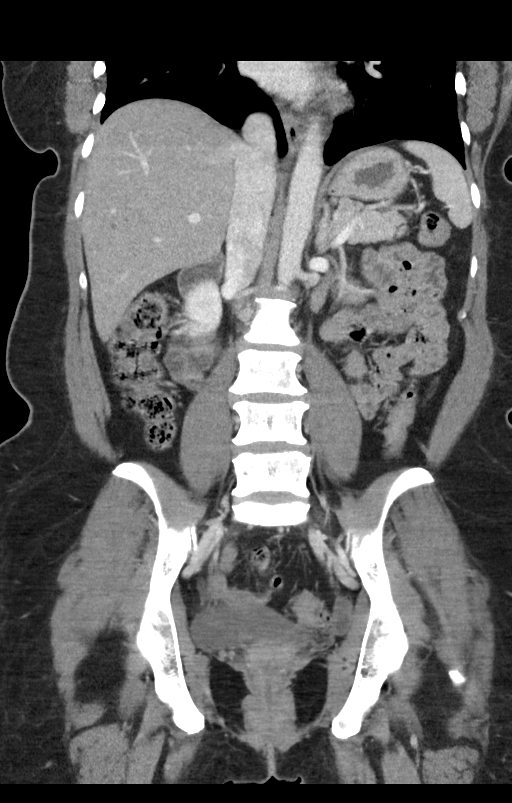
[im 56/101  soft-tissue]
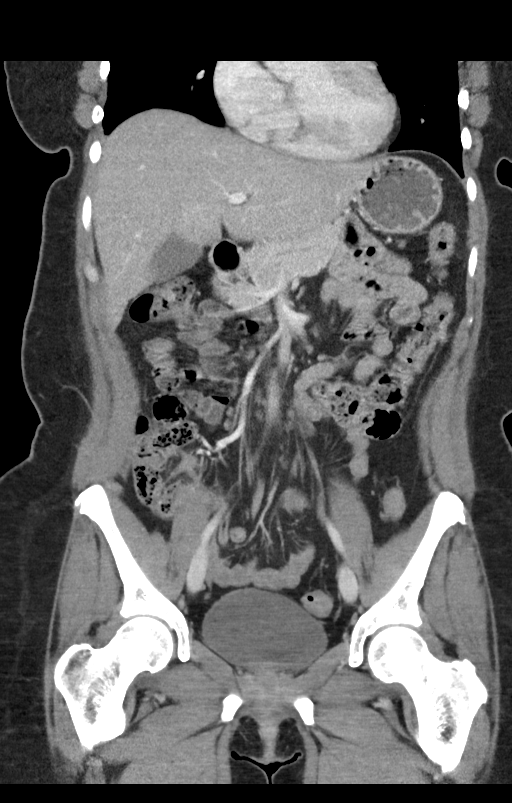

[16 of 46 positions shown; findings below may reference images not displayed]

FINDINGS: Lower chest: Lung bases are clear. No effusions. Heart is normal
size.

Hepatobiliary: Scattered small hypodensities in the liver, likely
small cysts. Gallbladder unremarkable.

Pancreas: No focal abnormality or ductal dilatation.

Spleen: No focal abnormality.  Normal size.

Adrenals/Urinary Tract: No adrenal abnormality. No focal renal
abnormality. No stones or hydronephrosis. Urinary bladder is
unremarkable.

Stomach/Bowel: Normal appendix. Stomach, large and small bowel
grossly unremarkable.

Vascular/Lymphatic: No evidence of aneurysm or adenopathy.

Reproductive: Prior hysterectomy.  No adnexal masses.

Other: No free fluid or free air.

Musculoskeletal: No acute bony abnormality.
IMPRESSION: No acute findings in the abdomen or pelvis.
